# Patient Record
Sex: Female | Born: 1965 | Race: White | Hispanic: No | Marital: Married | Smoking: Never smoker
Health system: Southern US, Community
[De-identification: ages and names within clinical notes are randomized; demographics above are authoritative.]

## PROBLEM LIST (undated history)

## (undated) DIAGNOSIS — T4145XA Adverse effect of unspecified anesthetic, initial encounter: Secondary | ICD-10-CM

## (undated) DIAGNOSIS — T8859XA Other complications of anesthesia, initial encounter: Secondary | ICD-10-CM

## (undated) DIAGNOSIS — E785 Hyperlipidemia, unspecified: Secondary | ICD-10-CM

## (undated) HISTORY — PX: TONSILLECTOMY: SUR1361

## (undated) HISTORY — PX: WISDOM TOOTH EXTRACTION: SHX21

---

## 1999-01-28 ENCOUNTER — Ambulatory Visit (HOSPITAL_COMMUNITY): Admission: RE | Admit: 1999-01-28 | Discharge: 1999-01-28 | Payer: Self-pay | Admitting: Obstetrics and Gynecology

## 1999-04-07 ENCOUNTER — Inpatient Hospital Stay (HOSPITAL_COMMUNITY): Admission: AD | Admit: 1999-04-07 | Discharge: 1999-04-09 | Payer: Self-pay | Admitting: Obstetrics and Gynecology

## 2003-02-18 ENCOUNTER — Other Ambulatory Visit: Admission: RE | Admit: 2003-02-18 | Discharge: 2003-02-18 | Payer: Self-pay | Admitting: *Deleted

## 2004-04-27 ENCOUNTER — Other Ambulatory Visit: Admission: RE | Admit: 2004-04-27 | Discharge: 2004-04-27 | Payer: Self-pay | Admitting: Obstetrics and Gynecology

## 2005-05-04 ENCOUNTER — Other Ambulatory Visit: Admission: RE | Admit: 2005-05-04 | Discharge: 2005-05-04 | Payer: Self-pay | Admitting: Obstetrics and Gynecology

## 2005-05-11 ENCOUNTER — Ambulatory Visit (HOSPITAL_COMMUNITY): Admission: RE | Admit: 2005-05-11 | Discharge: 2005-05-11 | Payer: Self-pay | Admitting: Obstetrics and Gynecology

## 2006-05-05 ENCOUNTER — Other Ambulatory Visit: Admission: RE | Admit: 2006-05-05 | Discharge: 2006-05-05 | Payer: Self-pay | Admitting: Obstetrics and Gynecology

## 2009-05-25 ENCOUNTER — Ambulatory Visit (HOSPITAL_COMMUNITY): Admission: RE | Admit: 2009-05-25 | Discharge: 2009-05-25 | Payer: Self-pay | Admitting: Obstetrics and Gynecology

## 2011-06-14 ENCOUNTER — Other Ambulatory Visit (HOSPITAL_COMMUNITY): Payer: Self-pay | Admitting: Obstetrics and Gynecology

## 2011-06-14 DIAGNOSIS — Z1231 Encounter for screening mammogram for malignant neoplasm of breast: Secondary | ICD-10-CM

## 2011-06-15 ENCOUNTER — Ambulatory Visit (HOSPITAL_COMMUNITY)
Admission: RE | Admit: 2011-06-15 | Discharge: 2011-06-15 | Disposition: A | Discharge status: Home / Self Care | Payer: Managed Care, Other (non HMO) | Source: Ambulatory Visit | Attending: Obstetrics and Gynecology | Admitting: Obstetrics and Gynecology

## 2011-06-15 DIAGNOSIS — Z1231 Encounter for screening mammogram for malignant neoplasm of breast: Secondary | ICD-10-CM | POA: Insufficient documentation

## 2012-07-17 ENCOUNTER — Ambulatory Visit: Payer: Self-pay | Admitting: Obstetrics and Gynecology

## 2014-07-23 ENCOUNTER — Other Ambulatory Visit: Payer: Self-pay | Admitting: Obstetrics and Gynecology

## 2014-10-01 ENCOUNTER — Other Ambulatory Visit: Payer: Self-pay | Admitting: Obstetrics and Gynecology

## 2014-10-01 NOTE — Patient Instructions (Addendum)
   Your procedure is scheduled on: Monday, Dec 14  Enter through the Main Entrance of [facility] at: 10:45 AM Pick up the phone at the desk and dial [phone] and inform us of your arrival.  Please call this number if you have any problems the morning of surgery: [phone]  Remember: Do not eat food after midnight: Sunday Do not drink clear liquids after: 8 AM Monday, day of surgery Take these medicines the morning of surgery with a SIP OF WATER:  None  Do not wear jewelry, make-up, or FINGER nail polish No metal in your hair or on your body. Do not wear lotions, powders, perfumes.  You may wear deodorant.  Do not bring valuables to the hospital. Contacts, dentures or bridgework may not be worn into surgery.  Leave suitcase in the car. After Surgery it may be brought to your room. For patients being admitted to the hospital, checkout time is 11:00am the day of discharge.  Home with husband Barbaraann Rondo cell [phone]

## 2014-10-02 ENCOUNTER — Encounter (HOSPITAL_COMMUNITY): Payer: Self-pay

## 2014-10-02 ENCOUNTER — Encounter (HOSPITAL_COMMUNITY)
Admission: RE | Admit: 2014-10-02 | Discharge: 2014-10-02 | Disposition: A | Discharge status: Home / Self Care | Payer: BC Managed Care – PPO | Source: Ambulatory Visit | Attending: Obstetrics and Gynecology | Admitting: Obstetrics and Gynecology

## 2014-10-02 DIAGNOSIS — Z01812 Encounter for preprocedural laboratory examination: Secondary | ICD-10-CM | POA: Diagnosis present

## 2014-10-02 HISTORY — DX: Hyperlipidemia, unspecified: E78.5

## 2014-10-02 LAB — CBC
HEMATOCRIT: 38.4 % (ref 36.0–46.0)
HEMOGLOBIN: 12.8 g/dL (ref 12.0–15.0)
MCH: 28.3 pg (ref 26.0–34.0)
MCHC: 33.3 g/dL (ref 30.0–36.0)
MCV: 85 fL (ref 78.0–100.0)
Platelets: 208 10*3/uL (ref 150–400)
RBC: 4.52 MIL/uL (ref 3.87–5.11)
RDW: 13.6 % (ref 11.5–15.5)
WBC: 6.3 10*3/uL (ref 4.0–10.5)

## 2014-10-04 ENCOUNTER — Other Ambulatory Visit (HOSPITAL_COMMUNITY): Payer: Self-pay | Admitting: Obstetrics and Gynecology

## 2014-10-04 NOTE — H&P (Signed)
Jillian Dennis is a 48 y.o. 30 female P 2-0-0-2 presents for a hysterectomy with anterior-posterior colporrhaphy  because of uterine fibroids, menorrhagia and symptomatic pelvic prolapse.  For the past two years the patient has had an increasingly heavy menstrual flow.  Her 7 day period will require an hourly pad change due to large clots and is accompanied by cramping that she rates at 7/10 on a 10 point pain scale.  Fortunately she finds relief with Ibuprofen 600 mg.  She denies dyspareunia, inter-menstrual bleeding or changes in bowel funciton.  She does report, however leaking of urine with coughing or sneezing and occasional urgency.   An endometrial biopsy in September 2015 showed secretory endometrium with no evidence of malignancy or hyperplasia.  Her TSH, CBC and Prolactin levels were all normal.  In September of 2014 a pelvic ultrasound showed a uterus: 8.29 x 7.31 x 7.02 cm with a posterior sub-serosal fibroid measuring:   4.9 x 4.3 x 4.7 cm with both ovaries appearing normal on that study.  A review of both medical and surgical management options were givne to the patient however, she desire definitive therapy in the form of hysterectomy and anterior-posterior colporrhaphy.  Additonally, due to her family history of ovarian cancer,  the patient would also like to have both of her ovaries removed.   Past Medical History  OB History: G: 2;  P: 2-0-0-2; SVB 1997 and 2000, largest infant weighed 8 lbs. 2 oz.  GYN History: menarche:48 YO    LMP: 09/29/14  Contracepton condoms  The patient reports a past history of: genital warts.  Denies history of abnormal PAP smear  Last PAP smear: August 2015-normal  Medical History: NONE  Surgical History: 1978  Tonsillectomy Denies problems with anesthesia (though awakened during procedure) or history of blood transfusions  Family History: Heart Disease, Rheumatoid Arthritis and Ovarian Cancer  Social History: Married and employed as a Pharmacist, hospital;  Denies  tobacco use and rarely consumes alcohol   Outpatient Encounter Prescriptions as of 10/04/2014  Medication Sig  . Calcium Carbonate-Vit D-Min (CALCIUM 1200) 1200-1000 MG-UNIT CHEW Chew 1 tablet by mouth daily.  Marland Kitchen ibuprofen (ADVIL,MOTRIN) 200 MG tablet Take 400 mg by mouth every 6 (six) hours as needed for moderate pain.  . Multiple Vitamins-Minerals (MULTIVITAMIN WITH MINERALS) tablet Take 1 tablet by mouth daily.    No Known Allergies   Denies sensitivity to peanuts, shellfish, soy, latex or adhesives.   ROS: Admits to urinary incontinence with cough or sneeze but denies corrective lenses,  headache, vision changes, nasal congestion, dysphagia, tinnitus, dizziness, hoarseness, cough,  chest pain, shortness of breath, nausea, vomiting, diarrhea,constipation,  urinary frequency, urgency  dysuria, hematuria, vaginitis symptoms, pelvic pain, swelling of joints,easy bruising,  myalgias, arthralgias, skin rashes, unexplained weight loss and except as is mentioned in the history of present illness, patient's review of systems is otherwise negative.   Physical Exam  Bp: 100/62    P: 76    R: 18    Temperature:  98 degrees F orally    Weight:  150 lbs.  Height: 5'4"    BMi:  25.7  Neck: supple without masses or thyromegaly Lungs: clear to auscultation Heart: regular rate and rhythm Abdomen: soft, non-tender and no organomegaly Pelvic:EGBUS- wnl; vagina-2-3/4 cystocele and 2/4 rectocele; uterus-normal size with 2/4 descensus, cervix without lesions or motion tenderness; adnexae-no tenderness or masses Extremities:  no clubbing, cyanosis or edema   Assesment:  Menorrhagia  Symptomatic Uterine Fibroid            Symptomatic Pelvic Relaxation   Disposition:  A discussion was held with patient regarding the indication for her procedure(s) along with the risks, which include but are not limited to: reaction to anesthesia, damage to adjacent organs, infection and excessive bleeding.  The  patient verbalized understanding of these risks and has consented to proceed with a Total Vaginal Hysterectomy, Possible Bilateral Salpingectomy, Possible Bilateral Salpingo-oophorectomy, with Anterior-Posterior Colporrhaphy and Possible Total Abdominal Hysterectomy at [place] on October 13, 2014.   CSN# [id number]   Ziomara Birenbaum J. Florene Glen, PA-C  for Dr. Harvie Bridge. Mancel Bale

## 2014-10-08 ENCOUNTER — Other Ambulatory Visit (HOSPITAL_COMMUNITY): Payer: Self-pay | Admitting: Obstetrics and Gynecology

## 2014-10-13 ENCOUNTER — Encounter (HOSPITAL_COMMUNITY)
Admission: RE | Disposition: A | Discharge status: Home / Self Care | Payer: Self-pay | Source: Ambulatory Visit | Attending: Obstetrics and Gynecology

## 2014-10-13 ENCOUNTER — Encounter (HOSPITAL_COMMUNITY): Payer: Self-pay | Admitting: Anesthesiology

## 2014-10-13 ENCOUNTER — Ambulatory Visit (HOSPITAL_COMMUNITY): Payer: BC Managed Care – PPO | Admitting: Anesthesiology

## 2014-10-13 ENCOUNTER — Observation Stay (HOSPITAL_COMMUNITY)
Admission: RE | Admit: 2014-10-13 | Discharge: 2014-10-16 | Disposition: A | Discharge status: Home / Self Care | Payer: BC Managed Care – PPO | Source: Ambulatory Visit | Attending: Obstetrics and Gynecology | Admitting: Obstetrics and Gynecology

## 2014-10-13 DIAGNOSIS — Z8041 Family history of malignant neoplasm of ovary: Secondary | ICD-10-CM | POA: Insufficient documentation

## 2014-10-13 DIAGNOSIS — N814 Uterovaginal prolapse, unspecified: Secondary | ICD-10-CM | POA: Insufficient documentation

## 2014-10-13 DIAGNOSIS — N920 Excessive and frequent menstruation with regular cycle: Principal | ICD-10-CM | POA: Insufficient documentation

## 2014-10-13 DIAGNOSIS — R509 Fever, unspecified: Secondary | ICD-10-CM

## 2014-10-13 DIAGNOSIS — R0902 Hypoxemia: Secondary | ICD-10-CM

## 2014-10-13 DIAGNOSIS — D259 Leiomyoma of uterus, unspecified: Secondary | ICD-10-CM | POA: Diagnosis not present

## 2014-10-13 HISTORY — PX: VAGINAL HYSTERECTOMY: SHX2639

## 2014-10-13 LAB — PREGNANCY, URINE: Preg Test, Ur: NEGATIVE

## 2014-10-13 SURGERY — HYSTERECTOMY, VAGINAL
Anesthesia: General | Site: Vagina

## 2014-10-13 MED ORDER — KETOROLAC TROMETHAMINE 30 MG/ML IJ SOLN: 15.0000 mg | Freq: Once | INTRAMUSCULAR | Status: DC | PRN

## 2014-10-13 MED ORDER — HYDROMORPHONE HCL 1 MG/ML IJ SOLN
0.2500 mg | INTRAMUSCULAR | Status: DC | PRN
  Administered 2014-10-13 (×2): 0.5 mg via INTRAVENOUS

## 2014-10-13 MED ORDER — MEPERIDINE HCL 25 MG/ML IJ SOLN: 6.2500 mg | INTRAMUSCULAR | Status: DC | PRN

## 2014-10-13 MED ORDER — HYDROMORPHONE HCL 1 MG/ML IJ SOLN
INTRAMUSCULAR | Status: DC | PRN
  Administered 2014-10-13 (×2): 1 mg via INTRAVENOUS

## 2014-10-13 MED ORDER — ONDANSETRON HCL 4 MG PO TABS: 4.0000 mg | ORAL_TABLET | Freq: Three times a day (TID) | ORAL | Status: DC | PRN

## 2014-10-13 MED ORDER — HYDROMORPHONE HCL 1 MG/ML IJ SOLN
INTRAMUSCULAR | Status: AC
  Filled 2014-10-13: qty 1

## 2014-10-13 MED ORDER — ONDANSETRON HCL 4 MG/2ML IJ SOLN
INTRAMUSCULAR | Status: AC
  Filled 2014-10-13: qty 2

## 2014-10-13 MED ORDER — DIPHENHYDRAMINE HCL 50 MG/ML IJ SOLN: 12.5000 mg | Freq: Four times a day (QID) | INTRAMUSCULAR | Status: DC | PRN

## 2014-10-13 MED ORDER — FENTANYL CITRATE 0.05 MG/ML IJ SOLN
INTRAMUSCULAR | Status: AC
Start: 2014-10-13 — End: 2014-10-13
  Filled 2014-10-13: qty 5

## 2014-10-13 MED ORDER — 0.9 % SODIUM CHLORIDE (POUR BTL) OPTIME
TOPICAL | Status: DC | PRN
  Administered 2014-10-13: 1000 mL

## 2014-10-13 MED ORDER — MIDAZOLAM HCL 2 MG/2ML IJ SOLN
INTRAMUSCULAR | Status: AC
  Filled 2014-10-13: qty 2

## 2014-10-13 MED ORDER — PROPOFOL 10 MG/ML IV EMUL
INTRAVENOUS | Status: AC
  Filled 2014-10-13: qty 20

## 2014-10-13 MED ORDER — LACTATED RINGERS IV SOLN
INTRAVENOUS | Status: DC
  Administered 2014-10-13 – 2014-10-14 (×3): via INTRAVENOUS

## 2014-10-13 MED ORDER — ONDANSETRON HCL 4 MG/2ML IJ SOLN: INTRAMUSCULAR | Status: DC | PRN

## 2014-10-13 MED ORDER — SODIUM CHLORIDE 0.9 % IV SOLN
INTRAVENOUS | Status: DC | PRN
  Administered 2014-10-13: 9 mL via INTRAMUSCULAR
  Administered 2014-10-13: 6 mL via INTRAMUSCULAR

## 2014-10-13 MED ORDER — NALOXONE HCL 0.4 MG/ML IJ SOLN
0.4000 mg | INTRAMUSCULAR | Status: DC | PRN
Start: 2014-10-13 — End: 2014-10-14

## 2014-10-13 MED ORDER — KETOROLAC TROMETHAMINE 30 MG/ML IJ SOLN
INTRAMUSCULAR | Status: DC | PRN
  Administered 2014-10-13: 30 mg via INTRAVENOUS

## 2014-10-13 MED ORDER — BUPIVACAINE HCL (PF) 0.25 % IJ SOLN
INTRAMUSCULAR | Status: AC
  Filled 2014-10-13: qty 30

## 2014-10-13 MED ORDER — ROCURONIUM BROMIDE 100 MG/10ML IV SOLN
INTRAVENOUS | Status: DC | PRN
  Administered 2014-10-13 (×3): 10 mg via INTRAVENOUS
  Administered 2014-10-13: 50 mg via INTRAVENOUS

## 2014-10-13 MED ORDER — METHYLENE BLUE 1 % INJ SOLN
INTRAMUSCULAR | Status: DC | PRN
  Administered 2014-10-13: 10 mg via INTRAVENOUS

## 2014-10-13 MED ORDER — SCOPOLAMINE 1 MG/3DAYS TD PT72
1.0000 | MEDICATED_PATCH | Freq: Once | TRANSDERMAL | Status: DC
  Administered 2014-10-13: 1.5 mg via TRANSDERMAL

## 2014-10-13 MED ORDER — METHYLENE BLUE 1 % INJ SOLN
INTRAMUSCULAR | Status: AC
  Filled 2014-10-13: qty 1

## 2014-10-13 MED ORDER — MIDAZOLAM HCL 2 MG/2ML IJ SOLN: 0.5000 mg | Freq: Once | INTRAMUSCULAR | Status: DC | PRN

## 2014-10-13 MED ORDER — GLYCOPYRROLATE 0.2 MG/ML IJ SOLN
INTRAMUSCULAR | Status: AC
  Filled 2014-10-13: qty 1

## 2014-10-13 MED ORDER — ESTRADIOL 0.1 MG/GM VA CREA
TOPICAL_CREAM | VAGINAL | Status: DC | PRN
  Administered 2014-10-13: 1 via VAGINAL

## 2014-10-13 MED ORDER — ONDANSETRON HCL 4 MG/2ML IJ SOLN
4.0000 mg | Freq: Four times a day (QID) | INTRAMUSCULAR | Status: DC | PRN
  Administered 2014-10-14: 4 mg via INTRAVENOUS
  Filled 2014-10-13: qty 2

## 2014-10-13 MED ORDER — DOCUSATE SODIUM 100 MG PO CAPS
100.0000 mg | ORAL_CAPSULE | Freq: Two times a day (BID) | ORAL | Status: AC
  Administered 2014-10-14 – 2014-10-15 (×3): 100 mg via ORAL
  Filled 2014-10-13 (×3): qty 1

## 2014-10-13 MED ORDER — MENTHOL 3 MG MT LOZG
1.0000 | LOZENGE | OROMUCOSAL | Status: DC | PRN
  Filled 2014-10-13: qty 9

## 2014-10-13 MED ORDER — LACTATED RINGERS IV SOLN
INTRAVENOUS | Status: DC
  Administered 2014-10-13 (×2): via INTRAVENOUS

## 2014-10-13 MED ORDER — LIDOCAINE HCL (CARDIAC) 20 MG/ML IV SOLN
INTRAVENOUS | Status: DC | PRN
  Administered 2014-10-13: 40 mg via INTRAVENOUS
  Administered 2014-10-13: 30 mg via INTRAVENOUS

## 2014-10-13 MED ORDER — VASOPRESSIN 20 UNIT/ML IV SOLN
INTRAVENOUS | Status: AC
  Filled 2014-10-13: qty 1

## 2014-10-13 MED ORDER — KETOROLAC TROMETHAMINE 30 MG/ML IJ SOLN
30.0000 mg | Freq: Four times a day (QID) | INTRAMUSCULAR | Status: AC
  Administered 2014-10-13 – 2014-10-14 (×3): 30 mg via INTRAVENOUS
  Filled 2014-10-13 (×3): qty 1

## 2014-10-13 MED ORDER — HEPARIN SODIUM (PORCINE) 5000 UNIT/ML IJ SOLN
INTRAMUSCULAR | Status: AC
  Filled 2014-10-13: qty 1

## 2014-10-13 MED ORDER — FENTANYL CITRATE 0.05 MG/ML IJ SOLN
INTRAMUSCULAR | Status: DC | PRN
  Administered 2014-10-13 (×5): 50 ug via INTRAVENOUS

## 2014-10-13 MED ORDER — CEFAZOLIN SODIUM-DEXTROSE 2-3 GM-% IV SOLR
2.0000 g | INTRAVENOUS | Status: AC
  Administered 2014-10-13: 2 g via INTRAVENOUS

## 2014-10-13 MED ORDER — GLYCOPYRROLATE 0.2 MG/ML IJ SOLN
INTRAMUSCULAR | Status: DC | PRN
  Administered 2014-10-13: 0.2 mg via INTRAVENOUS
  Administered 2014-10-13: 0.4 mg via INTRAVENOUS

## 2014-10-13 MED ORDER — DIPHENHYDRAMINE HCL 12.5 MG/5ML PO ELIX: 12.5000 mg | ORAL_SOLUTION | Freq: Four times a day (QID) | ORAL | Status: DC | PRN

## 2014-10-13 MED ORDER — NEOSTIGMINE METHYLSULFATE 10 MG/10ML IV SOLN
INTRAVENOUS | Status: AC
  Filled 2014-10-13: qty 1

## 2014-10-13 MED ORDER — HYDROMORPHONE 0.3 MG/ML IV SOLN
INTRAVENOUS | Status: DC
  Administered 2014-10-13: 18:00:00 via INTRAVENOUS
  Administered 2014-10-13: 2.1 mg via INTRAVENOUS
  Administered 2014-10-14: 1.2 mg via INTRAVENOUS
  Administered 2014-10-14: 0.9 mg via INTRAVENOUS
  Filled 2014-10-13: qty 25

## 2014-10-13 MED ORDER — PROMETHAZINE HCL 25 MG/ML IJ SOLN: 6.2500 mg | INTRAMUSCULAR | Status: DC | PRN

## 2014-10-13 MED ORDER — DEXAMETHASONE SODIUM PHOSPHATE 4 MG/ML IJ SOLN
INTRAMUSCULAR | Status: AC
  Filled 2014-10-13: qty 1

## 2014-10-13 MED ORDER — SODIUM CHLORIDE 0.9 % IJ SOLN
INTRAMUSCULAR | Status: AC
  Filled 2014-10-13: qty 100

## 2014-10-13 MED ORDER — NEOSTIGMINE METHYLSULFATE 10 MG/10ML IV SOLN
INTRAVENOUS | Status: DC | PRN
Start: 2014-10-13 — End: 2014-10-13
  Administered 2014-10-13: 3 mg via INTRAVENOUS

## 2014-10-13 MED ORDER — SODIUM CHLORIDE 0.9 % IJ SOLN: 9.0000 mL | INTRAMUSCULAR | Status: DC | PRN

## 2014-10-13 MED ORDER — PROPOFOL 10 MG/ML IV BOLUS
INTRAVENOUS | Status: DC | PRN
  Administered 2014-10-13: 160 mg via INTRAVENOUS

## 2014-10-13 MED ORDER — SCOPOLAMINE 1 MG/3DAYS TD PT72
MEDICATED_PATCH | TRANSDERMAL | Status: AC
  Administered 2014-10-13: 1.5 mg via TRANSDERMAL
  Filled 2014-10-13: qty 1

## 2014-10-13 MED ORDER — OXYCODONE-ACETAMINOPHEN 5-325 MG PO TABS
1.0000 | ORAL_TABLET | ORAL | Status: DC | PRN
  Administered 2014-10-14: 2 via ORAL
  Administered 2014-10-14: 1 via ORAL
  Administered 2014-10-14: 2 via ORAL
  Administered 2014-10-15 (×2): 1 via ORAL
  Administered 2014-10-15 (×2): 2 via ORAL
  Administered 2014-10-15: 1 via ORAL
  Administered 2014-10-16 (×2): 2 via ORAL
  Administered 2014-10-16 (×2): 1 via ORAL
  Filled 2014-10-13 (×2): qty 1
  Filled 2014-10-13 (×2): qty 2
  Filled 2014-10-13 (×2): qty 1
  Filled 2014-10-13 (×3): qty 2
  Filled 2014-10-13 (×2): qty 1
  Filled 2014-10-13: qty 2

## 2014-10-13 MED ORDER — ROCURONIUM BROMIDE 100 MG/10ML IV SOLN
INTRAVENOUS | Status: AC
  Filled 2014-10-13: qty 1

## 2014-10-13 MED ORDER — MIDAZOLAM HCL 2 MG/2ML IJ SOLN
INTRAMUSCULAR | Status: DC | PRN
  Administered 2014-10-13: 1 mg via INTRAVENOUS

## 2014-10-13 MED ORDER — KETOROLAC TROMETHAMINE 30 MG/ML IJ SOLN
INTRAMUSCULAR | Status: AC
  Filled 2014-10-13: qty 1

## 2014-10-13 MED ORDER — GLYCOPYRROLATE 0.2 MG/ML IJ SOLN
INTRAMUSCULAR | Status: AC
  Filled 2014-10-13: qty 3

## 2014-10-13 MED ORDER — SODIUM CHLORIDE 0.9 % IJ SOLN
INTRAMUSCULAR | Status: AC
  Filled 2014-10-13: qty 10

## 2014-10-13 MED ORDER — CEFAZOLIN SODIUM-DEXTROSE 2-3 GM-% IV SOLR
INTRAVENOUS | Status: AC
  Filled 2014-10-13: qty 50

## 2014-10-13 MED ORDER — IBUPROFEN 600 MG PO TABS
600.0000 mg | ORAL_TABLET | Freq: Four times a day (QID) | ORAL | Status: DC | PRN
  Administered 2014-10-14 – 2014-10-16 (×5): 600 mg via ORAL
  Filled 2014-10-13 (×5): qty 1

## 2014-10-13 MED ORDER — LIDOCAINE HCL (CARDIAC) 20 MG/ML IV SOLN
INTRAVENOUS | Status: AC
  Filled 2014-10-13: qty 5

## 2014-10-13 MED ORDER — ESTRADIOL 0.1 MG/GM VA CREA
TOPICAL_CREAM | VAGINAL | Status: AC
  Filled 2014-10-13: qty 42.5

## 2014-10-13 MED ORDER — DEXAMETHASONE SODIUM PHOSPHATE 10 MG/ML IJ SOLN
INTRAMUSCULAR | Status: DC | PRN
  Administered 2014-10-13: 4 mg via INTRAVENOUS

## 2014-10-13 SURGICAL SUPPLY — 99 items
BARRIER ADHS 3X4 INTERCEED (GAUZE/BANDAGES/DRESSINGS) IMPLANT
BLADE SURG 10 STRL SS (BLADE) ×4 IMPLANT
BLADE SURG 11 STRL SS (BLADE) IMPLANT
BRR ADH 4X3 ABS CNTRL BYND (GAUZE/BANDAGES/DRESSINGS)
CABLE HIGH FREQUENCY MONO STRZ (ELECTRODE) IMPLANT
CANISTER SUCT 3000ML (MISCELLANEOUS) ×8 IMPLANT
CATH FOLEY 2W 5CC 18FR LF (CATHETERS) ×4 IMPLANT
CATH ROBINSON RED A/P 16FR (CATHETERS) IMPLANT
CLOSURE WOUND 1/4 X3 (GAUZE/BANDAGES/DRESSINGS)
CLOTH BEACON ORANGE TIMEOUT ST (SAFETY) ×4 IMPLANT
CONT PATH 16OZ SNAP LID 3702 (MISCELLANEOUS) ×4 IMPLANT
COVER BACK TABLE 60X90IN (DRAPES) ×4 IMPLANT
COVER MAYO STAND STRL (DRAPES) ×4 IMPLANT
DECANTER SPIKE VIAL GLASS SM (MISCELLANEOUS) ×4 IMPLANT
DISSECTOR SPONGE CHERRY (GAUZE/BANDAGES/DRESSINGS) IMPLANT
DRAPE HYSTEROSCOPY (DRAPE) IMPLANT
DRAPE SHEET LG 3/4 BI-LAMINATE (DRAPES) ×8 IMPLANT
DRAPE STERI URO 9X17 APER PCH (DRAPES) ×4 IMPLANT
DRAPE WARM FLUID 44X44 (DRAPE) IMPLANT
DRSG COVADERM PLUS 2X2 (GAUZE/BANDAGES/DRESSINGS) IMPLANT
DRSG OPSITE POSTOP 3X4 (GAUZE/BANDAGES/DRESSINGS) IMPLANT
DRSG OPSITE POSTOP 4X10 (GAUZE/BANDAGES/DRESSINGS) IMPLANT
DRSG TELFA 3X8 NADH (GAUZE/BANDAGES/DRESSINGS) ×4 IMPLANT
DURAPREP 26ML APPLICATOR (WOUND CARE) ×4 IMPLANT
ELECT REM PT RETURN 9FT ADLT (ELECTROSURGICAL) ×4
ELECTRODE REM PT RTRN 9FT ADLT (ELECTROSURGICAL) ×2 IMPLANT
EVACUATOR SMOKE 8.L (FILTER) ×4 IMPLANT
FORCEPS CUTTING 33CM 5MM (CUTTING FORCEPS) IMPLANT
GAUZE PACKING 2X5 YD STRL (GAUZE/BANDAGES/DRESSINGS) ×4 IMPLANT
GAUZE SPONGE 4X4 16PLY XRAY LF (GAUZE/BANDAGES/DRESSINGS) ×8 IMPLANT
GLOVE BIO SURGEON STRL SZ7.5 (GLOVE) ×12 IMPLANT
GLOVE BIOGEL PI IND STRL 6.5 (GLOVE) ×6 IMPLANT
GLOVE BIOGEL PI IND STRL 7.0 (GLOVE) IMPLANT
GLOVE BIOGEL PI IND STRL 7.5 (GLOVE) ×4 IMPLANT
GLOVE BIOGEL PI INDICATOR 6.5 (GLOVE) ×6
GLOVE BIOGEL PI INDICATOR 7.0 (GLOVE)
GLOVE BIOGEL PI INDICATOR 7.5 (GLOVE) ×4
GOWN STRL REUS W/ TWL LRG LVL3 (GOWN DISPOSABLE) ×14 IMPLANT
GOWN STRL REUS W/TWL LRG LVL3 (GOWN DISPOSABLE) ×44 IMPLANT
HEMOSTAT SURGICEL 4X8 (HEMOSTASIS) IMPLANT
LIQUID BAND (GAUZE/BANDAGES/DRESSINGS) IMPLANT
NEEDLE HYPO 22GX1.5 SAFETY (NEEDLE) ×4 IMPLANT
NEEDLE HYPO 25X1 1.5 SAFETY (NEEDLE) IMPLANT
NEEDLE INSUFFLATION 120MM (ENDOMECHANICALS) IMPLANT
NEEDLE MAYO .5 CIRCLE (NEEDLE) IMPLANT
NS IRRIG 1000ML POUR BTL (IV SOLUTION) ×4 IMPLANT
OCCLUDER COLPOPNEUMO (BALLOONS) IMPLANT
PACK LAVH (CUSTOM PROCEDURE TRAY) ×4 IMPLANT
PACK VAGINAL WOMENS (CUSTOM PROCEDURE TRAY) ×4 IMPLANT
PAD OB MATERNITY 4.3X12.25 (PERSONAL CARE ITEMS) ×8 IMPLANT
PAD TRENDELENBURG OR TABLE (MISCELLANEOUS) ×4 IMPLANT
PROTECTOR NERVE ULNAR (MISCELLANEOUS) IMPLANT
SCISSORS LAP 5X35 DISP (ENDOMECHANICALS) IMPLANT
SET CYSTO W/LG BORE CLAMP LF (SET/KITS/TRAYS/PACK) ×4 IMPLANT
SET IRRIG TUBING LAPAROSCOPIC (IRRIGATION / IRRIGATOR) IMPLANT
SHEARS HARMONIC ACE PLUS 36CM (ENDOMECHANICALS) IMPLANT
SLEEVE XCEL OPT CAN 5 100 (ENDOMECHANICALS) IMPLANT
SOLUTION ELECTROLUBE (MISCELLANEOUS) IMPLANT
SPONGE LAP 18X18 X RAY DECT (DISPOSABLE) IMPLANT
STAPLER VISISTAT 35W (STAPLE) IMPLANT
STRIP CLOSURE SKIN 1/4X3 (GAUZE/BANDAGES/DRESSINGS) IMPLANT
SUT CHROMIC 2 0 CT 1 (SUTURE) IMPLANT
SUT CHROMIC 2 0 SH (SUTURE) IMPLANT
SUT CHROMIC 2 0 TIES 18 (SUTURE) IMPLANT
SUT MNCRL AB 3-0 PS2 27 (SUTURE) IMPLANT
SUT MON AB 3-0 SH 27 (SUTURE)
SUT MON AB 3-0 SH27 (SUTURE) IMPLANT
SUT MON AB 4-0 PS1 27 (SUTURE) ×4 IMPLANT
SUT PDS AB 1 CT1 36 (SUTURE) IMPLANT
SUT PDS AB 1 CTX 36 (SUTURE) IMPLANT
SUT PLAIN 2 0 XLH (SUTURE) IMPLANT
SUT VIC AB 0 CT1 18XCR BRD8 (SUTURE) ×8 IMPLANT
SUT VIC AB 0 CT1 27 (SUTURE) ×12
SUT VIC AB 0 CT1 27XBRD ANBCTR (SUTURE) ×8 IMPLANT
SUT VIC AB 0 CT1 36 (SUTURE) ×4 IMPLANT
SUT VIC AB 0 CT1 8-18 (SUTURE) ×16
SUT VIC AB 2-0 CT1 27 (SUTURE) ×44
SUT VIC AB 2-0 CT1 TAPERPNT 27 (SUTURE) ×22 IMPLANT
SUT VIC AB 2-0 SH 27 (SUTURE) ×16
SUT VIC AB 2-0 SH 27XBRD (SUTURE) ×8 IMPLANT
SUT VIC AB 3-0 SH 27 (SUTURE) ×12
SUT VIC AB 3-0 SH 27X BRD (SUTURE) ×6 IMPLANT
SUT VICRYL 0 TIES 12 18 (SUTURE) ×4 IMPLANT
SUT VICRYL 0 UR6 27IN ABS (SUTURE) IMPLANT
SYR 50ML LL SCALE MARK (SYRINGE) IMPLANT
SYR CONTROL 10ML LL (SYRINGE) IMPLANT
SYR TB 1ML 25GX5/8 (SYRINGE) ×4 IMPLANT
SYR TB 1ML LUER SLIP (SYRINGE) ×4 IMPLANT
TIP UTERINE 5.1X6CM LAV DISP (MISCELLANEOUS) IMPLANT
TIP UTERINE 6.7X10CM GRN DISP (MISCELLANEOUS) IMPLANT
TIP UTERINE 6.7X6CM WHT DISP (MISCELLANEOUS) IMPLANT
TIP UTERINE 6.7X8CM BLUE DISP (MISCELLANEOUS) IMPLANT
TOWEL OR 17X24 6PK STRL BLUE (TOWEL DISPOSABLE) ×8 IMPLANT
TRAY FOLEY CATH 14FR (SET/KITS/TRAYS/PACK) ×4 IMPLANT
TROCAR BALLN 12MMX100 BLUNT (TROCAR) IMPLANT
TROCAR XCEL NON-BLD 11X100MML (ENDOMECHANICALS) IMPLANT
TROCAR XCEL NON-BLD 5MMX100MML (ENDOMECHANICALS) IMPLANT
WARMER LAPAROSCOPE (MISCELLANEOUS) ×4 IMPLANT
WATER STERILE IRR 1000ML POUR (IV SOLUTION) ×4 IMPLANT

## 2014-10-13 NOTE — Transfer of Care (Signed)
Immediate Anesthesia Transfer of Care Note  Patient: Jillian Dennis  Procedure(s) Performed: Procedure(s): TOTAL VAGINAL HYSTERECTOMY BILATERAL SALPINGECTOMY WITH ANTERIOR AND POSTERIOR REPAIR, CYSTOSCOPY (Bilateral)  Patient Location: PACU  Anesthesia Type:General  Level of Consciousness: awake, alert  and oriented  Airway & Oxygen Therapy: Patient Spontanous Breathing and Patient connected to nasal cannula oxygen  Post-op Assessment: Report given to PACU RN and Post -op Vital signs reviewed and stable  Post vital signs: Reviewed and stable  Complications: No apparent anesthesia complications

## 2014-10-13 NOTE — Discharge Instructions (Signed)
Call [facility] @ [phone] if:  You have a temperature greater than or equal to 100.4 degrees Farenheit orally You have pain that is not made better by the pain medication given and taken as directed You have excessive bleeding or problems urinating  Take Colace (Docusate Sodium/Stool Softener) 100 mg 2-3 times daily while taking narcotic pain medicine to avoid constipation or until bowel movements are regular.  You may drive after 1 week You may walk up steps You may shower   You may resume a regular diet Do not lift over 15 pounds for 6 weeks Avoid anything in vagina for 6 weeks (or until after your post-operative visit)

## 2014-10-13 NOTE — Op Note (Signed)
Preop Diagnosis: 1.Menorrhagia 2.Fibroids 3.Symptomatic Prolapse  Postop Diagnosis: 1.Menorrhagia 2.Fibroids 3.Symptomatic Prolapse  Procedure: 1.TVH 2.BS 3.Posterior Repair 4.Cystoscopy   Anesthesia: General   Anesthesiologist: Assunta Gambles, MD   Attending: Delice Lesch, MD   Assistant: Earnstine Regal, PA-C  Findings: Normal appearing bilateral ovaries and tubes.  Uterus with approximately 4cm posterior fibroid.  Pathology: Uterus, Cervix and Bilateral Fallopian Tubes  Fluids: 1500 cc  UOP: 900 cc  EBL: 284 cc  Complications: None  Procedure:The patient was taken to the operating room after the risks, benefits and alternatives were discussed with the patient. The patient verbalized understanding and consent signed and witnessed. The patient was placed under general anesthesia per the anesthesiologist and a timeout was performed per protocol. The patient was prepped and draped in the normal sterile fashion in the dorsal lithotomy position.  A weighted speculum was placed the patient's vagina and the anterior lip of the cervix was grasped with a single-tooth tenaculum and Dever retractors were placed for vaginal wall retraction. The cervix was circumscribed with the bovie after injecting the cervix with pitressin at a concentration of 20 units of Pitressin in 50 cc of normal saline.  Once the cervix was circumscribed the anterior cul-de-sac was entered without difficulty.  The posterior cul-de-sac was entered without difficulty as well.  Curved Heaney clamps were used to clamp the uterosacral and cardinal ligaments and the tissue was then cut and suture ligated using 0 Vicryl. This was done bilaterally and sequentially. The remaining parametrial tissue was clamped, cut and suture ligated using 0 Vicryl in a sequential and bilateral fashion as well.  The posterior fibroid was removed via myomectomy in order to exteriorize the uterine fundus.  The uterine fundus was exteriorized and the  remaining pedicles were bilaterally clamped, cut and suture ligated with 0 vicryl.  The uterus and cervix were handed off to be sent to pathology. The bilateral ovaries and fallopian tubes appeared to be within normal limits.  A kelly clamp was clamped along the length of the right fallopian tube.  Tube was excised and sent to pathology.  Two 0 vicryl ties were used to ligate the pedicle and good hemostasis noted.  The left fallopian tube was attempted to be grasped in a similar fashion however it did not have as much descensus so it had to be done sequentially.  The was some oozing along the mesosalpinx which was made hemostatic with a running and intermittently interlocked stitch of 3-0 vicryl along the edge.  The angles of the cuff were sutured using 0 vicryl.  The cuff was then repaired to the midline with figure-of-eight and interrupted stitches of 0 Vicryl.  The cuff was noted to be hemostatic.    The cystocele was not impressive not that the hysterectomy was performed.  Attention was then turned to the posterior vaginal wall where dilute Pitressin was injected. The posterior vaginal wall was incised and the underlying tissue was dissected away from the posterior vaginal wall. The rectocele was repaired using plication stitches of 2-0 Vicryl.  Excess tissue was excised and the posterior vaginal wall was repaired with 2-0 Vicryl via a running interlocking stitch after reinforcing the perineal body.  The perineum was repaired with 3-0 Vicryl via subcutaneous stitch.  The foley was removed and cytoscopy performed with efflux of ureters bilaterally.  Foley was replaced.  Estrogen soaked packing placed in vagina.    Sponge, lap and needle count was correct.  The patient tolerated the procedure well and was  returned to the recovery room in good condition.

## 2014-10-13 NOTE — Anesthesia Preprocedure Evaluation (Signed)
Anesthesia Evaluation  Patient identified by MRN, date of birth, ID band Patient awake    Reviewed: Allergy & Precautions, H&P , Patient's Chart, lab work & pertinent test results, reviewed documented beta blocker date and time   History of Anesthesia Complications Negative for: history of anesthetic complications  Airway Mallampati: II  TM Distance: >3 FB Neck ROM: full    Dental   Pulmonary  breath sounds clear to auscultation        Cardiovascular Exercise Tolerance: Good Rhythm:regular Rate:Normal     Neuro/Psych    GI/Hepatic   Endo/Other    Renal/GU      Musculoskeletal   Abdominal   Peds  Hematology   Anesthesia Other Findings   Reproductive/Obstetrics                             Anesthesia Physical Anesthesia Plan  ASA: I  Anesthesia Plan: General ETT   Post-op Pain Management:    Induction:   Airway Management Planned:   Additional Equipment:   Intra-op Plan:   Post-operative Plan:   Informed Consent: I have reviewed the patients History and Physical, chart, labs and discussed the procedure including the risks, benefits and alternatives for the proposed anesthesia with the patient or authorized representative who has indicated his/her understanding and acceptance.   Dental Advisory Given  Plan Discussed with: CRNA and Surgeon  Anesthesia Plan Comments:         Anesthesia Quick Evaluation

## 2014-10-13 NOTE — Progress Notes (Signed)
Day of Surgery Procedure(s) (LRB): TOTAL VAGINAL HYSTERECTOMY BILATERAL SALPINGECTOMY WITH POSTERIOR REPAIR, CYSTOSCOPY (Bilateral)  Subjective: Patient reports no complaints.  Denies N/V. Pain well controlled.    Objective: I have reviewed patient's vital signs and UOP about 100cc/1.5 hrs.  General: alert and no distress Resp: clear to auscultation bilaterally Cardio: regular rate and rhythm GI: soft, NT, decreased BS, no rebound or guarding Extremities: SCDs are on, no calf tenderness Vaginal Bleeding: none  Assessment: s/p Procedure(s): TOTAL VAGINAL HYSTERECTOMY BILATERAL SALPINGECTOMY WITH POSTERIOR REPAIR, CYSTOSCOPY (Bilateral): stable Progressing well  Plan: Advance diet Encourage IS  SCDs for DVT prophylaxis CBC in AM Good UOP   LOS: 0 days    Jillian Dennis Y 10/13/2014, 7:59 PM

## 2014-10-13 NOTE — Anesthesia Postprocedure Evaluation (Signed)
  Anesthesia Post-op Note Anesthesia Post Note  Patient: Jillian Dennis  Procedure(s) Performed: Procedure(s) (LRB): TOTAL VAGINAL HYSTERECTOMY BILATERAL SALPINGECTOMY WITH POSTERIOR REPAIR, CYSTOSCOPY (Bilateral)  Anesthesia type: General  Patient location: PACU  Post pain: Pain level controlled  Post assessment: Post-op Vital signs reviewed  Last Vitals:  Filed Vitals:   10/13/14 1639  BP: 119/77  Pulse:   Temp: 37.1 C  Resp: 16    Post vital signs: Reviewed  Level of consciousness: sedated  Complications: No apparent anesthesia complications

## 2014-10-13 NOTE — H&P (View-Only) (Signed)
Jillian Dennis is a 48 y.o. 23 female P 2-0-0-2 presents for a hysterectomy with anterior-posterior colporrhaphy  because of uterine fibroids, menorrhagia and symptomatic pelvic prolapse.  For the past two years the patient has had an increasingly heavy menstrual flow.  Her 7 day period will require an hourly pad change due to large clots and is accompanied by cramping that she rates at 7/10 on a 10 point pain scale.  Fortunately she finds relief with Ibuprofen 600 mg.  She denies dyspareunia, inter-menstrual bleeding or changes in bowel funciton.  She does report, however leaking of urine with coughing or sneezing and occasional urgency.   An endometrial biopsy in September 2015 showed secretory endometrium with no evidence of malignancy or hyperplasia.  Her TSH, CBC and Prolactin levels were all normal.  In September of 2014 a pelvic ultrasound showed a uterus: 8.29 x 7.31 x 7.02 cm with a posterior sub-serosal fibroid measuring:   4.9 x 4.3 x 4.7 cm with both ovaries appearing normal on that study.  A review of both medical and surgical management options were givne to the patient however, she desire definitive therapy in the form of hysterectomy and anterior-posterior colporrhaphy.  Additonally, due to her family history of ovarian cancer,  the patient would also like to have both of her ovaries removed.   Past Medical History  OB History: G: 2;  P: 2-0-0-2; SVB 1997 and 2000, largest infant weighed 8 lbs. 2 oz.  GYN History: menarche:48 YO    LMP: 09/29/14  Contracepton condoms  The patient reports a past history of: genital warts.  Denies history of abnormal PAP smear  Last PAP smear: August 2015-normal  Medical History: NONE  Surgical History: 1978  Tonsillectomy Denies problems with anesthesia (though awakened during procedure) or history of blood transfusions  Family History: Heart Disease, Rheumatoid Arthritis and Ovarian Cancer  Social History: Married and employed as a Pharmacist, hospital;  Denies  tobacco use and rarely consumes alcohol   Outpatient Encounter Prescriptions as of 10/04/2014  Medication Sig  . Calcium Carbonate-Vit D-Min (CALCIUM 1200) 1200-1000 MG-UNIT CHEW Chew 1 tablet by mouth daily.  Marland Kitchen ibuprofen (ADVIL,MOTRIN) 200 MG tablet Take 400 mg by mouth every 6 (six) hours as needed for moderate pain.  . Multiple Vitamins-Minerals (MULTIVITAMIN WITH MINERALS) tablet Take 1 tablet by mouth daily.    No Known Allergies   Denies sensitivity to peanuts, shellfish, soy, latex or adhesives.   ROS: Admits to urinary incontinence with cough or sneeze but denies corrective lenses,  headache, vision changes, nasal congestion, dysphagia, tinnitus, dizziness, hoarseness, cough,  chest pain, shortness of breath, nausea, vomiting, diarrhea,constipation,  urinary frequency, urgency  dysuria, hematuria, vaginitis symptoms, pelvic pain, swelling of joints,easy bruising,  myalgias, arthralgias, skin rashes, unexplained weight loss and except as is mentioned in the history of present illness, patient's review of systems is otherwise negative.   Physical Exam  Bp: 100/62    P: 76    R: 18    Temperature:  98 degrees F orally    Weight:  150 lbs.  Height: 5'4"    BMi:  25.7  Neck: supple without masses or thyromegaly Lungs: clear to auscultation Heart: regular rate and rhythm Abdomen: soft, non-tender and no organomegaly Pelvic:EGBUS- wnl; vagina-2-3/4 cystocele and 2/4 rectocele; uterus-normal size with 2/4 descensus, cervix without lesions or motion tenderness; adnexae-no tenderness or masses Extremities:  no clubbing, cyanosis or edema   Assesment:  Menorrhagia  Symptomatic Uterine Fibroid            Symptomatic Pelvic Relaxation   Disposition:  A discussion was held with patient regarding the indication for her procedure(s) along with the risks, which include but are not limited to: reaction to anesthesia, damage to adjacent organs, infection and excessive bleeding.  The  patient verbalized understanding of these risks and has consented to proceed with a Total Vaginal Hysterectomy, Possible Bilateral Salpingectomy, Possible Bilateral Salpingo-oophorectomy, with Anterior-Posterior Colporrhaphy and Possible Total Abdominal Hysterectomy at [place] on October 13, 2014.   CSN# [id number]   Ananth Fiallos J. Florene Glen, PA-C  for Dr. Harvie Bridge. Mancel Bale

## 2014-10-13 NOTE — Interval H&P Note (Signed)
History and Physical Interval Note:  10/13/2014 12:17 PM  Jillian Dennis  has presented today for surgery, with the diagnosis of Menorrhagia  The various methods of treatment have been discussed with the patient and family. After consideration of risks, benefits and other options for treatment, the patient has consented to  Procedure(s): TOTAL VAGINAL HYSTERECTOMY BILATERAL SALPINGECTOMY  (Bilateral) POSSIBLE LAPAROSCOPIC ASSISTED VAGINAL HYSTERECTOMY (N/A) POSSIBLE TOTAL ABDOMINAL HYSTERECTOMY WITH  (N/A) CYSTOSCOPY (N/A) as a surgical intervention .  The patient's history has been reviewed, patient examined, no change in status, stable for surgery.  I have reviewed the patient's chart and labs.  Questions were answered to the patient's satisfaction.  I discussed at length the patient's history and family history as well as the risks/benefits and alternatives of removal of ovaries on the phone last week and she has decided to keep her ovaries and confirms today, the day of surgery, that she would like to keep her ovaries.   Delice Lesch

## 2014-10-13 NOTE — Anesthesia Procedure Notes (Signed)
Procedure Name: Intubation Date/Time: 10/13/2014 12:52 PM Performed by: Tobin Chad Pre-anesthesia Checklist: Patient identified, Timeout performed, Emergency Drugs available, Suction available and Patient being monitored Patient Re-evaluated:Patient Re-evaluated prior to inductionOxygen Delivery Method: Circle system utilized Preoxygenation: Pre-oxygenation with 100% oxygen Intubation Type: IV induction Ventilation: Mask ventilation without difficulty Laryngoscope Size: Mac and 3 Grade View: Grade III Tube type: Oral Tube size: 7.0 mm Number of attempts: 1 Placement Confirmation: ETT inserted through vocal cords under direct vision Secured at: 21 cm Tube secured with: Tape

## 2014-10-14 ENCOUNTER — Observation Stay (HOSPITAL_COMMUNITY): Payer: BC Managed Care – PPO

## 2014-10-14 ENCOUNTER — Encounter (HOSPITAL_COMMUNITY): Payer: Self-pay | Admitting: Obstetrics and Gynecology

## 2014-10-14 DIAGNOSIS — N920 Excessive and frequent menstruation with regular cycle: Secondary | ICD-10-CM | POA: Diagnosis not present

## 2014-10-14 LAB — CBC
HEMATOCRIT: 28.7 % — AB (ref 36.0–46.0)
HEMATOCRIT: 28.9 % — AB (ref 36.0–46.0)
HEMOGLOBIN: 9.4 g/dL — AB (ref 12.0–15.0)
HEMOGLOBIN: 9.5 g/dL — AB (ref 12.0–15.0)
MCH: 27.9 pg (ref 26.0–34.0)
MCH: 28.3 pg (ref 26.0–34.0)
MCHC: 32.8 g/dL (ref 30.0–36.0)
MCHC: 32.9 g/dL (ref 30.0–36.0)
MCV: 85.2 fL (ref 78.0–100.0)
MCV: 86 fL (ref 78.0–100.0)
PLATELETS: 203 10*3/uL (ref 150–400)
Platelets: 157 10*3/uL (ref 150–400)
RBC: 3.37 MIL/uL — ABNORMAL LOW (ref 3.87–5.11)
RBC: 3.36 MIL/uL — ABNORMAL LOW (ref 3.87–5.11)
RDW: 13.8 % (ref 11.5–15.5)
RDW: 13.8 % (ref 11.5–15.5)
WBC: 8.2 10*3/uL (ref 4.0–10.5)
WBC: 6.9 10*3/uL (ref 4.0–10.5)

## 2014-10-14 LAB — CBC WITH DIFFERENTIAL/PLATELET
Basophils Absolute: 0 10*3/uL (ref 0.0–0.1)
Basophils Relative: 0 % (ref 0–1)
Eosinophils Absolute: 0.1 10*3/uL (ref 0.0–0.7)
Eosinophils Relative: 1 % (ref 0–5)
HEMATOCRIT: 28.1 % — AB (ref 36.0–46.0)
HEMOGLOBIN: 9.3 g/dL — AB (ref 12.0–15.0)
LYMPHS PCT: 26 % (ref 12–46)
Lymphs Abs: 1.6 10*3/uL (ref 0.7–4.0)
MCH: 28.9 pg (ref 26.0–34.0)
MCHC: 33.1 g/dL (ref 30.0–36.0)
MCV: 87.3 fL (ref 78.0–100.0)
MONO ABS: 0.4 10*3/uL (ref 0.1–1.0)
MONOS PCT: 7 % (ref 3–12)
NEUTROS ABS: 3.9 10*3/uL (ref 1.7–7.7)
Neutrophils Relative %: 66 % (ref 43–77)
Platelets: 167 10*3/uL (ref 150–400)
RBC: 3.22 MIL/uL — ABNORMAL LOW (ref 3.87–5.11)
RDW: 13.8 % (ref 11.5–15.5)
WBC: 6 10*3/uL (ref 4.0–10.5)

## 2014-10-14 LAB — BASIC METABOLIC PANEL
Anion gap: 7 (ref 5–15)
BUN: 6 mg/dL (ref 6–23)
CHLORIDE: 103 meq/L (ref 96–112)
CO2: 29 mEq/L (ref 19–32)
CREATININE: 0.88 mg/dL (ref 0.50–1.10)
Calcium: 8 mg/dL — ABNORMAL LOW (ref 8.4–10.5)
GFR calc Af Amer: 90 mL/min — ABNORMAL LOW (ref >90)
GFR calc non Af Amer: 78 mL/min — ABNORMAL LOW (ref >90)
Glucose, Bld: 99 mg/dL (ref 70–99)
Potassium: 3.8 mEq/L (ref 3.7–5.3)
Sodium: 139 mEq/L (ref 137–147)

## 2014-10-14 MED ORDER — IOHEXOL 350 MG/ML SOLN
100.0000 mL | Freq: Once | INTRAVENOUS | Status: AC | PRN
  Administered 2014-10-14: 100 mL via INTRAVENOUS

## 2014-10-14 MED ORDER — IBUPROFEN 600 MG PO TABS: ORAL_TABLET | ORAL | Status: DC

## 2014-10-14 MED ORDER — LACTATED RINGERS IV SOLN
INTRAVENOUS | Status: DC
  Administered 2014-10-14 – 2014-10-15 (×2): via INTRAVENOUS

## 2014-10-14 MED ORDER — SODIUM CHLORIDE 0.9 % IV SOLN
3.0000 g | Freq: Four times a day (QID) | INTRAVENOUS | Status: AC
  Administered 2014-10-14 – 2014-10-15 (×4): 3 g via INTRAVENOUS
  Filled 2014-10-14 (×4): qty 3

## 2014-10-14 MED ORDER — OXYCODONE-ACETAMINOPHEN 5-325 MG PO TABS: 1.0000 | ORAL_TABLET | Freq: Four times a day (QID) | ORAL | Status: DC | PRN

## 2014-10-14 MED ORDER — IOHEXOL 300 MG/ML  SOLN
50.0000 mL | INTRAMUSCULAR | Status: AC
  Administered 2014-10-14 (×2): 50 mL via ORAL

## 2014-10-14 NOTE — Addendum Note (Signed)
Addendum  created 10/14/14 1012 by Lenox Ponds, CRNA   Modules edited: Notes Section   Notes Section:  File: [id number]

## 2014-10-14 NOTE — Progress Notes (Signed)
Jillian Dennis is a41 y.o.  [id number]  Post Op Date # 1: TVH/Posterior Repair/BS/Cystoscopy Subjective: Patient is Doing well postoperatively. Patient has The patient is not having any pain. Ambulating in the halls without difficulty, tolerating liquids but hasn't voided yet (Foley just removed).  Hasn't passed flatus.   Objective: Vital signs in last 24 hours: Temp:  [97.9 F (36.6 C)-99.2 F (37.3 C)] 97.9 F (36.6 C) (12/15 0544) Pulse Rate:  [56-68] 59 (12/15 0544) Resp:  [13-19] 18 (12/15 0544) BP: (86-129)/(49-82) 86/49 mmHg (12/15 0544) SpO2:  [95 %-100 %] 99 % (12/15 0544) Weight:  [145 lb (65.772 kg)] 145 lb (65.772 kg) (12/14 2135)  Intake/Output from previous day: 12/14 0701 - 12/15 0700 In: 5017.5 [P.O.:1880; I.V.:3137.5] Out: 3050 [Urine:2700] Intake/Output this shift: Total I/O In: 3317.5 [P.O.:1880; I.V.:1437.5] Out: 1600 [Urine:1600]  Recent Labs Lab 10/14/14 0525  WBC 8.2  HGB 9.4*  HCT 28.7*  PLT 203    No results for input(s): NA, K, CL, CO2, BUN, CREATININE, CALCIUM, PROT, BILITOT, ALKPHOS, ALT, AST, GLUCOSE in the last 168 hours.  Invalid input(s): LABALBU  EXAM: General: alert, cooperative and no distress Resp: clear to auscultation bilaterally Cardio: regular rate and rhythm, S1, S2 normal, no murmur, click, rub or gallop GI: Bowel sounds present, soft, appropriately tender with deep palpation. Extremities: SCD hose in place and functioning; no calf tenderness.   Assessment: s/p Procedure(s): TOTAL VAGINAL HYSTERECTOMY BILATERAL SALPINGECTOMY WITH POSTERIOR REPAIR, CYSTOSCOPY: stable, progressing well and anemia  Plan: Advance diet Advance to PO medication Awating patient to void   Probable discharge home today  LOS: 1 day    Dennis,ELMIRA, PA-C 10/14/2014 6:27 AM  Pt seen at about 1p and BP noted to be low 80s/60s.  Pt said she felt a little dizzy earlier with ambulation but otherwise she felt ok.  A CBC was ordered and RN  notified me that Hgb was stable but pt now with temp of 101.2 and O2 sat of 93%.  RN had pt cough and deep breath and O2 sat went to 96%.  Pt seen at about 6p.  Good BS noted, denies flatus, nausea or excess pain.  Pt said she ambulated and no longer felt dizzy and feels better than she did earlier today.  If spikes another temp, will start antibiotics and for now will restart IV fluids, check blood cultures, order CT to eval for PE or pelvic abscess and urine culture.  Pt has been using incentive spirometer.  There is also no calf tenderness on exam.   Pt is voiding wihtout difficulty and has minimal vaginal bleeding.

## 2014-10-14 NOTE — Anesthesia Postprocedure Evaluation (Signed)
  Anesthesia Post-op Note  Patient: Jillian Dennis  Procedure(s) Performed: Procedure(s): TOTAL VAGINAL HYSTERECTOMY BILATERAL SALPINGECTOMY WITH POSTERIOR REPAIR, CYSTOSCOPY (Bilateral)  Patient Location: [facility]  Anesthesia Type:General  Level of Consciousness: awake, alert  and oriented  Airway and Oxygen Therapy: Patient Spontanous Breathing  Post-op Pain: mild  Post-op Assessment: Post-op Vital signs reviewed, Patient's Cardiovascular Status Stable, Respiratory Function Stable, Patent Airway, No signs of Nausea or vomiting and Pain level controlled  Post-op Vital Signs: Reviewed and stable  Last Vitals:  Filed Vitals:   10/14/14 0544  BP: 86/49  Pulse: 59  Temp: 36.6 C  Resp: 18    Complications: No apparent anesthesia complications

## 2014-10-14 NOTE — Discharge Summary (Signed)
Physician Discharge Summary  Patient ID: Jillian Dennis MRN: [id number] DOB/AGE: 03-03-1966 48 y.o.  Admit date: 10/13/2014 Discharge date: 10/14/2014   Discharge Diagnoses: Symptomatic Uterine Fibroid, Menorrhagia and Pelvic Relaxation Active Problems:   Menorrhagia   Operation: Total Vaginal Hysterectomy, Bilateral Salpingectomy, Posterior Repair and Cystoscopy   Discharged Condition: Good  Hospital Course: On the date of admission the patient underwent the aforementioned procedures and tolerated them well.  Disposition: Home to self care  Discharge Medications:    Medication List    TAKE these medications        CALCIUM 1200 1200-1000 MG-UNIT Chew  Chew 1 tablet by mouth daily.     ibuprofen 600 MG tablet  Commonly known as:  ADVIL,MOTRIN  1 po  pc every 6 hours x 5 days then prn-pain     multivitamin with minerals tablet  Take 1 tablet by mouth daily.     oxyCODONE-acetaminophen 5-325 MG per tablet  Commonly known as:  PERCOCET/ROXICET  Take 1-2 tablets by mouth every 6 (six) hours as needed for severe pain (moderate to severe pain (when tolerating fluids)).        Integra Plus 1 capsule every day and Colace 1 tablet twice a day      Follow-up: Dr. Harvie Bridge. Mancel Bale on November 21, 2013 at 9:30 a.m.   SignedEarnstine Regal, PA-C 10/14/2014, 6:37 AM

## 2014-10-15 DIAGNOSIS — N920 Excessive and frequent menstruation with regular cycle: Secondary | ICD-10-CM | POA: Diagnosis not present

## 2014-10-15 LAB — CBC WITH DIFFERENTIAL/PLATELET
BASOS ABS: 0 10*3/uL (ref 0.0–0.1)
Basophils Relative: 0 % (ref 0–1)
Eosinophils Absolute: 0.1 10*3/uL (ref 0.0–0.7)
Eosinophils Relative: 2 % (ref 0–5)
HEMATOCRIT: 25.8 % — AB (ref 36.0–46.0)
Hemoglobin: 8.3 g/dL — ABNORMAL LOW (ref 12.0–15.0)
LYMPHS PCT: 28 % (ref 12–46)
Lymphs Abs: 1.7 10*3/uL (ref 0.7–4.0)
MCH: 28.2 pg (ref 26.0–34.0)
MCHC: 32.2 g/dL (ref 30.0–36.0)
MCV: 87.8 fL (ref 78.0–100.0)
Monocytes Absolute: 0.4 10*3/uL (ref 0.1–1.0)
Monocytes Relative: 7 % (ref 3–12)
Neutro Abs: 3.8 10*3/uL (ref 1.7–7.7)
Neutrophils Relative %: 63 % (ref 43–77)
PLATELETS: 149 10*3/uL — AB (ref 150–400)
RBC: 2.94 MIL/uL — ABNORMAL LOW (ref 3.87–5.11)
RDW: 13.9 % (ref 11.5–15.5)
WBC: 6 10*3/uL (ref 4.0–10.5)

## 2014-10-15 MED ORDER — AMPICILLIN-SULBACTAM SODIUM 3 (2-1) G IJ SOLR
3.0000 g | Freq: Once | INTRAMUSCULAR | Status: AC
  Administered 2014-10-16: 3 g via INTRAVENOUS
  Filled 2014-10-15: qty 3

## 2014-10-15 MED ORDER — SODIUM CHLORIDE 0.9 % IV SOLN
3.0000 g | Freq: Once | INTRAVENOUS | Status: AC
  Administered 2014-10-16: 3 g via INTRAVENOUS
  Filled 2014-10-15: qty 3

## 2014-10-15 MED ORDER — FERROUS SULFATE 325 (65 FE) MG PO TABS
325.0000 mg | ORAL_TABLET | Freq: Two times a day (BID) | ORAL | Status: DC
  Administered 2014-10-15 – 2014-10-16 (×3): 325 mg via ORAL
  Filled 2014-10-15 (×3): qty 1

## 2014-10-15 NOTE — Plan of Care (Signed)
Problem: Phase I Progression Outcomes Goal: IS, TCDB as ordered Outcome: Completed/Met Date Met:  10/15/14 IS TCDB q1h

## 2014-10-15 NOTE — Progress Notes (Signed)
UR chart review completed.  

## 2014-10-15 NOTE — Progress Notes (Addendum)
Jillian Dennis is a48 y.o.  [id number]  Post Op Date # 2:  TVH/BS/Posterior Repair/Cystoscopy  Subjective: Patient is clinically doing well post op.  Current temperature is normal though H/H decraseing (perhaps due to dilution). Patient has minimal pain,  3/10 on a 10 point pain scale that is crampy but no other complaints.  Denies any dizziness or lightheadedness with ambulation.  Bowel and bladder function remain normal.    Objective: Vital signs in last 24 hours: Temp:  [98.6 F (37 C)-101.2 F (38.4 C)] 99 F (37.2 C) (12/16 0428) Pulse Rate:  [60-81] 71 (12/16 0428) Resp:  [16-18] 16 (12/16 0428) BP: (80-94)/(40-52) 80/50 mmHg (12/16 0428) SpO2:  [89 %-98 %] 97 % (12/16 0555)  Intake/Output from previous day: 12/15 0701 - 12/16 0700 In: 4135 [P.O.:1920; I.V.:2015] Out: 5500 [Urine:5500] Intake/Output this shift:    Recent Labs Lab 10/14/14 1318 10/14/14 1920 10/15/14 0512  WBC 6.9 6.0 6.0  HGB 9.5* 9.3* 8.3*  HCT 28.9* 28.1* 25.8*  PLT 157 167 149*     Recent Labs Lab 10/14/14 1920  NA 139  K 3.8  CL 103  CO2 29  BUN 6  CREATININE 0.88  CALCIUM 8.0*  GLUCOSE 99    EXAM: General: alert, cooperative and no distress Resp: clear to auscultation bilaterally and normal percussion bilaterally Cardio: regular rate and rhythm, S1, S2 normal, no murmur, click, rub or gallop GI: Bowel sounds present, soft Extremities: no calf tenderness.   Assessment: s/p Procedure(s): TOTAL VAGINAL HYSTERECTOMY BILATERAL SALPINGECTOMY WITH POSTERIOR REPAIR, CYSTOSCOPY: stable, anemia and fever resolved.  Plan: Encourage ambulation Discontinue IV fluids Begin FeSO4  325 mg  bid  Continue Colace 100 mg  bid Routine care  LOS: 2 days    POWELL,ELMIRA, PA-C 10/15/2014 7:36 AM  Pt seen a lunch time and again this evening at approx 5pm.  She had reported feeling better.  She is passing flatus, tolerating po without n/v, ambulating without lightheadedness or dizziness.   She continues to deny SOB and never has reported any SOB even though her O2 sat was in the 80's.  Chest CT did not reveal a PE and all it revealed was segmental bilateral atelectasis.  Pelvic CT done secondary to unexplained fever was negative for any collections.  Vigorous IS was done last night and the O2 sats improved.  She was on O2 overnight.  Pt was started on antibiotics yesterday evening after having fever to 101.2 and then 100.9.  WBC has remained normal without a left shift.  Without other etiologies of the fever, I suspect the desaturation as well as the fever are all related to the large amount of bilateral atelectasis.  Will cont to observe pt overnight.  May d/c antibiotics after having a normal temp for 24hrs as a precaution.

## 2014-10-16 DIAGNOSIS — N920 Excessive and frequent menstruation with regular cycle: Secondary | ICD-10-CM | POA: Diagnosis not present

## 2014-10-16 LAB — CBC WITH DIFFERENTIAL/PLATELET
BASOS PCT: 0 % (ref 0–1)
Basophils Absolute: 0 10*3/uL (ref 0.0–0.1)
Eosinophils Absolute: 0.2 10*3/uL (ref 0.0–0.7)
Eosinophils Relative: 5 % (ref 0–5)
HCT: 25.8 % — ABNORMAL LOW (ref 36.0–46.0)
HEMOGLOBIN: 8.4 g/dL — AB (ref 12.0–15.0)
LYMPHS ABS: 1.3 10*3/uL (ref 0.7–4.0)
Lymphocytes Relative: 27 % (ref 12–46)
MCH: 28.5 pg (ref 26.0–34.0)
MCHC: 32.6 g/dL (ref 30.0–36.0)
MCV: 87.5 fL (ref 78.0–100.0)
MONO ABS: 0.3 10*3/uL (ref 0.1–1.0)
Monocytes Relative: 7 % (ref 3–12)
Neutro Abs: 2.9 10*3/uL (ref 1.7–7.7)
Neutrophils Relative %: 61 % (ref 43–77)
Platelets: 127 10*3/uL — ABNORMAL LOW (ref 150–400)
RBC: 2.95 MIL/uL — ABNORMAL LOW (ref 3.87–5.11)
RDW: 13.5 % (ref 11.5–15.5)
WBC: 4.8 10*3/uL (ref 4.0–10.5)

## 2014-10-16 LAB — URINE CULTURE

## 2014-10-16 LAB — CBC
HEMATOCRIT: 26.4 % — AB (ref 36.0–46.0)
HEMOGLOBIN: 8.8 g/dL — AB (ref 12.0–15.0)
MCH: 28.8 pg (ref 26.0–34.0)
MCHC: 33.3 g/dL (ref 30.0–36.0)
MCV: 86.3 fL (ref 78.0–100.0)
Platelets: 151 10*3/uL (ref 150–400)
RBC: 3.06 MIL/uL — ABNORMAL LOW (ref 3.87–5.11)
RDW: 13.5 % (ref 11.5–15.5)
WBC: 4.7 10*3/uL (ref 4.0–10.5)

## 2014-10-16 MED ORDER — INTEGRA PLUS PO CAPS: 1.0000 | ORAL_CAPSULE | Freq: Every day | ORAL | Status: DC

## 2014-10-16 NOTE — Progress Notes (Signed)
Jillian Dennis is a51 y.o.  [id number]  Post Op Date # TVH/BS/Posterior Repair/Cystoscopy Subjective: Patient is reporting that she feels much better. Patient has some vaginal pain rated at 5/10 on a 10 point pain scale with some relief from oral analgesia.  ([place]). Tolerating a regular diet, ambulating, and voiding without difficulty.   Objective: Vital signs in last 24 hours: Temp:  [98.4 F (36.9 C)-99.4 F (37.4 C)] 98.4 F (36.9 C) (12/17 0816) Pulse Rate:  [55-70] 66 (12/17 0500) Resp:  [18] 18 (12/17 0500) BP: (84-99)/(55-61) 93/61 mmHg (12/17 0500) SpO2:  [93 %-99 %] 93 % (12/17 0500)  Intake/Output from previous day: 12/16 0701 - 12/17 0700 In: 720 [P.O.:720] Out: 2600 [Urine:2600] Intake/Output this shift:    Recent Labs Lab 10/14/14 1920 10/15/14 0512 10/16/14 0528  WBC 6.0 6.0 4.8  HGB 9.3* 8.3* 8.4*  HCT 28.1* 25.8* 25.8*  PLT 167 149* 127*     Recent Labs Lab 10/14/14 1920  NA 139  K 3.8  CL 103  CO2 29  BUN 6  CREATININE 0.88  CALCIUM 8.0*  GLUCOSE 99    EXAM: General: alert, cooperative and no distress Resp: clear to auscultation bilaterally and normal percussion bilaterally Cardio: regular rate and rhythm, S1, S2 normal, no murmur, click, rub or gallop GI: Bowel sounds present and soft Extremities: No calf tenderness and negative Homan's sign   Assessment: s/p Procedure(s): TOTAL VAGINAL HYSTERECTOMY BILATERAL SALPINGECTOMY WITH POSTERIOR REPAIR, CYSTOSCOPY: stable and progressing well  Plan: Routine care  Increase dose of Percocet to #2 tablets in an effort to achieve better pain management  LOS: 3 days    POWELL,ELMIRA, PA-C 10/16/2014 8:54 AM   Pt says she feels well and ready to go home.  I am concerned that her temp was still 99.4 at 5am and platelets were low on cbc this morning.  Last dose of unasyn was at 6am.  UCx was neg, Blood Cx x 2 neg, no evidence of infection based on WBC, hgb stable and BP improved.   Overall the pt is clinically doing well and continues to deny light headedness or dizziness with ambulation and only dark spotting at the most on her pad.  Pain is well controlled on percocet.  I discussed keeping her overnight to watch her until she is 24hrs afebrile off abx.  She would really like to go home today.  I will recheck CBC at 5pm to make sure a consumptive process is not occuring and recheck additional vitals and if all looks good, will discharge home this evening.  D/C instructions reviewed.

## 2014-10-16 NOTE — Progress Notes (Signed)
Pt discharged home with husband... Discharge instructions reviewed with pt and she verbalized understanding... Condition stable... No equipment... Ambulated to car with J. Bass, NT.  

## 2014-10-20 LAB — CULTURE, BLOOD (ROUTINE X 2)
CULTURE: NO GROWTH
Culture: NO GROWTH

## 2014-10-22 ENCOUNTER — Ambulatory Visit (INDEPENDENT_AMBULATORY_CARE_PROVIDER_SITE_OTHER): Payer: BC Managed Care – PPO | Admitting: Internal Medicine

## 2014-10-22 ENCOUNTER — Encounter: Payer: Self-pay | Admitting: Gynecology

## 2014-10-22 ENCOUNTER — Encounter: Payer: Self-pay | Admitting: Internal Medicine

## 2014-10-22 ENCOUNTER — Ambulatory Visit: Payer: BC Managed Care – PPO | Attending: Gynecology | Admitting: Gynecology

## 2014-10-22 VITALS — BP 112/84 | HR 70 | Ht 64.0 in | Wt 146.0 lb

## 2014-10-22 VITALS — BP 136/71 | HR 73 | Temp 98.4°F | Resp 18 | Ht 64.0 in | Wt 145.3 lb

## 2014-10-22 DIAGNOSIS — D495 Neoplasm of unspecified behavior of other genitourinary organs: Secondary | ICD-10-CM | POA: Diagnosis not present

## 2014-10-22 DIAGNOSIS — Z90710 Acquired absence of both cervix and uterus: Secondary | ICD-10-CM | POA: Diagnosis not present

## 2014-10-22 DIAGNOSIS — Z9079 Acquired absence of other genital organ(s): Secondary | ICD-10-CM | POA: Insufficient documentation

## 2014-10-22 DIAGNOSIS — E785 Hyperlipidemia, unspecified: Secondary | ICD-10-CM | POA: Insufficient documentation

## 2014-10-22 DIAGNOSIS — J9601 Acute respiratory failure with hypoxia: Secondary | ICD-10-CM

## 2014-10-22 DIAGNOSIS — R911 Solitary pulmonary nodule: Secondary | ICD-10-CM

## 2014-10-22 DIAGNOSIS — D3910 Neoplasm of uncertain behavior of unspecified ovary: Secondary | ICD-10-CM

## 2014-10-22 DIAGNOSIS — Z8041 Family history of malignant neoplasm of ovary: Secondary | ICD-10-CM | POA: Diagnosis not present

## 2014-10-22 NOTE — Patient Instructions (Signed)
Preparing for your Surgery  Plan for surgery on Feb 2 with Dr. Everitt Amber.  Pre-operative Testing -You will receive a phone call from presurgical testing at [facility] to arrange for a pre-operative testing appointment before your surgery.  This appointment normally occurs one to two weeks before your scheduled surgery.   -Bring your insurance card, copy of an advanced directive if applicable, medication list  -At that visit, you will be asked to sign a consent for a possible blood transfusion in case a transfusion becomes necessary during surgery.  The need for a blood transfusion is rare but having consent is a necessary part of your care.     -You should not be taking blood thinners or aspirin at least ten days prior to surgery unless instructed by your surgeon.  Day Before Surgery at [place] will be asked to take in only clear liquids the day before surgery.  Examples of clear liquids include broths, jello, and clear juices.  You will be advised to have nothing to eat or drink after midnight the evening before.    Your role in recovery Your role is to become active as soon as directed by your doctor, while still giving yourself time to heal.  Rest when you feel tired. You will be asked to do the following in order to speed your recovery:  - Cough and breathe deeply. This helps toclear and expand your lungs and can prevent pneumonia. You may be given a spirometer to practice deep breathing. A staff member will show you how to use the spirometer. - Do mild physical activity. Walking or moving your legs help your circulation and body functions return to normal. A staff member will help you when you try to walk and will provide you with simple exercises. Do not try to get up or walk alone the first time. - Actively manage your pain. Managing your pain lets you move in comfort. We will ask you to rate your pain on a scale of zero to 10. It is your responsibility to tell  your doctor or nurse where and how much you hurt so your pain can be treated.  Special Considerations -If you are diabetic, you may be placed on insulin after surgery to have closer control over your blood sugars to promote healing and recovery.  This does not mean that you will be discharged on insulin.  If applicable, your oral antidiabetics will be resumed when you are tolerating a solid diet.  -Your final pathology results from surgery should be available by the Friday after surgery and the results will be relayed to you when available.

## 2014-10-22 NOTE — Progress Notes (Signed)
   Subjective:    Patient ID: Jillian Dennis, female    DOB: 06/21/1966   MRN: [id number]  HPI  17 yowf never smoker /no passive exp with good ex tol > hysterectomy > desats post op x 2 days > CT chest pos atx bases plus RML 6 mm nodule so referred to [facility] 10/22/14 by Jillian Dennis    10/22/2014 1st [facility] office visit/ Jillian Dennis   Chief Complaint  Patient presents with  . Pulmonary Consult    Referred by Dr. Everett Dennis. Pt states that after having hysterectomy on 10/13/14 she was advised her o2 sats dropped. CT chest was then performed 10/14/14 that was sig for pulmonary nodule.    feels has completely recovered s residual sob though not aerobic yet  No obvious   day to day or daytime variabilty or assoc chronic cough or cp or chest tightness, subjective wheeze overt sinus or hb symptoms. No unusual exp hx or h/o childhood pna/ asthma or knowledge of premature birth.  Sleeping ok without nocturnal  or early am exacerbation  of respiratory  c/o's or need for noct saba. Also denies any obvious fluctuation of symptoms with weather or environmental changes or other aggravating or alleviating factors except as outlined above   Current Medications, Allergies, Complete Past Medical History, Past Surgical History, Family History, and Social History were reviewed in [employer] record.              Review of Systems  Constitutional: Negative for fever, chills and unexpected weight change.  HENT: Negative for congestion, dental problem, ear pain, nosebleeds, postnasal drip, rhinorrhea, sinus pressure, sneezing, sore throat, trouble swallowing and voice change.   Eyes: Negative for visual disturbance.  Respiratory: Negative for cough, choking and shortness of breath.   Cardiovascular: Negative for chest pain and leg swelling.  Gastrointestinal: Negative for vomiting, abdominal pain and diarrhea.  Genitourinary: Negative for difficulty urinating.    Musculoskeletal: Negative for arthralgias.  Skin: Negative for rash.  Neurological: Positive for headaches. Negative for tremors and syncope.  Hematological: Does not bruise/bleed easily.       Objective:   Physical Exam Pleasant amb healthy appearing wf nad  Wt Readings from Last 3 Encounters:  10/22/14 146 lb (66.225 kg)  10/22/14 145 lb 4.8 oz (65.908 kg)  10/13/14 145 lb (65.772 kg)    Vital signs reviewed  HEENT: nl dentition, turbinates, and orophanx. Nl external ear canals without cough reflex   NECK :  without JVD/Nodes/TM/ nl carotid upstrokes bilaterally   LUNGS: no acc muscle use, clear to A and P bilaterally without cough on insp or exp maneuvers   CV:  RRR  no s3 or murmur or increase in P2, no edema   ABD:  soft and nontender with nl excursion in the supine position. No bruits or organomegaly, bowel sounds nl  MS:  warm without deformities, calf tenderness, cyanosis or clubbing  SKIN: warm and dry without lesions    NEURO:  alert, approp, no deficits    CTa 10/14/14 1. Negative for pulmonary embolism. 2. Bibasilar atelectasis. 3. Expected postoperative appearance of the pelvis. 4. 4 mm pulmonary nodule in the right middle lobe. If the patient is at high risk for bronchogenic carcinoma, follow-up chest CT at 1 year is recommended. If the patient is at low risk, no follow-up is needed.        Assessment & Plan:

## 2014-10-22 NOTE — Patient Instructions (Signed)
The earliest I would repeat a Ct would be 04/2015 > in computer for recall  The way to avoid post op complications 1) mobilize as soon as tolerated  2) Incentive spirometry as much as possible  3) Minimize post op sedation and pain meds

## 2014-10-22 NOTE — Progress Notes (Signed)
Consult Note: Gyn-Onc   Adela Glimpse 48 y.o. female  Chief Complaint  Patient presents with  . 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 Family history of ovarian cancer.  Plan: The patient's operative note pathology report and CT scan were reviewed.   I had a lengthy discussion with the patient and her husband regarding the surgical findings and pathology report from the vaginal hysterectomy and bilateral salpingectomy performed on 10/13/2014. We discussed the theoretical possibility that some ovarian cancers actually arise from the fallopian tube. We also discussed the fact that borderline tumors of fallopian tube are exceedingly rare and probably have a good prognosis. However, I would recommend that the patient undergo surgical exploration using minimally invasive techniques to include: Peritoneal cytology, intraperitoneal exploration, and bilateral oophorectomy. Should no additional disease be found, I would not anticipate any additional therapy. On the other hand, a remote chance of identifying a malignancy in the ovaries or peritoneal cavity exists and the patient indicates that she would prefer to undergo complete surgical staging at that time based on frozen section reports.  We will have the patient return to meet and discuss with Dr. Everitt Amber and schedule surgery for approximately 6 weeks from now. Surgery is scheduled for 12/02/2014.  HPI: 48 year old white married female gravida 2 seen in consultation request of Dr. 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 The patient underwent a vaginal hysterectomy and bilateral salpingectomy on 10/13/2014 for treatment of menorrhagia. The patient's postoperative course was uncomplicated. Final pathology showed a 3 mm serous borderline tumor in the fallopian tube. Both fallopian tubes were submitted together and therefore the laterality of  the tumor is not known. Postoperatively, the patient had some dyspnea and underwent a CT scan to rule out pulmonary embolus. The only significant finding were 2 small pulmonary nodules in the right middle lobe measuring 5 and 4 mm. The patient is scheduled to see a pulmonologist later today.  The patient has a family history of a paternal aunt with ovarian cancer.  Review of Systems:10 point review of systems is negative except as noted in interval history.   Vitals: Blood pressure 136/71, pulse 73, temperature 98.4 F (36.9 C), resp. rate 18, height 5\' 4"  (1.626 m), weight 145 lb 4.8 oz (65.908 kg), last menstrual period 09/21/2014.  Physical Exam: General : The patient is a healthy woman in no acute distress.  Remainder the exam is deferred because the patient's immediate postoperative state.      No Known Allergies  Past Medical History  Diagnosis Date  . SVD (spontaneous vaginal delivery)     x 2  . Hyperlipidemia     diet and exercise controlled - no med    Past Surgical History  Procedure Laterality Date  . Tonsillectomy    . Wisdom tooth extraction    . Vaginal hysterectomy Bilateral 10/13/2014    Procedure: TOTAL VAGINAL HYSTERECTOMY BILATERAL SALPINGECTOMY WITH POSTERIOR REPAIR, CYSTOSCOPY;  Surgeon: Delice Lesch, MD;  Location: [facility];  Service: Gynecology;  Laterality: Bilateral;    Current Outpatient Prescriptions  Medication Sig Dispense Refill  . Calcium Carbonate-Vit D-Min (CALCIUM 1200) 1200-1000 MG-UNIT CHEW Chew 1 tablet by mouth daily.    Marland Kitchen FeFum-FePoly-FA-B Cmp-C-Biot (INTEGRA PLUS) CAPS Take 1 capsule by mouth daily. 60 capsule 1  . ibuprofen (ADVIL,MOTRIN) 600 MG tablet 1 po  pc every 6 hours x 5 days then prn-pain 30 tablet  1  . Multiple Vitamins-Minerals (MULTIVITAMIN WITH MINERALS) tablet Take 1 tablet by mouth daily.    Marland Kitchen oxyCODONE-acetaminophen (PERCOCET/ROXICET) 5-325 MG per tablet Take 1-2 tablets by mouth every 6 (six) hours as needed for  severe pain (moderate to severe pain (when tolerating fluids)). 30 tablet 0   No current facility-administered medications for this visit.    History   Social History  . Marital Status: Married    Spouse Name: N/A    Number of Children: N/A  . Years of Education: N/A   Occupational History  . Not on file.   Social History Main Topics  . Smoking status: Never Smoker   . Smokeless tobacco: Never Used  . Alcohol Use: Yes     Comment: rare  . Drug Use: No  . Sexual Activity: Yes    Birth Control/ Protection: Condom   Other Topics Concern  . Not on file   Social History Narrative    History reviewed. No pertinent family history.    CLARKE-PEARSON,Rafik Koppel L, MD 10/22/2014, 9:20 AM

## 2014-10-23 ENCOUNTER — Encounter: Payer: Self-pay | Admitting: Internal Medicine

## 2014-10-23 DIAGNOSIS — J9600 Acute respiratory failure, unspecified whether with hypoxia or hypercapnia: Secondary | ICD-10-CM | POA: Insufficient documentation

## 2014-10-23 DIAGNOSIS — R918 Other nonspecific abnormal finding of lung field: Secondary | ICD-10-CM | POA: Insufficient documentation

## 2014-10-23 NOTE — Assessment & Plan Note (Signed)
10/14/14 post op from hysterectomy - see CT 10/14/14 with bibasilar atx   Resolved now but Clearly related to v/q mismatch or even shunting due to post op atx, discussed ways to treat it as needs more surgery on fallopian tubes  See instructions for specific recommendations which were reviewed directly with the patient who was given a copy with highlighter outlining the key components.

## 2014-10-23 NOTE — Assessment & Plan Note (Addendum)
-   never smoker, no additional risk factors - See CT chest 10/14/14 > in tickle file for recall 04/15/15   CT scans/ results reviewed with pt >>> Too small for PET or bx, not suspicious enough for excisional bx > really only option for now is follow the Fleischner society guidelines as rec by radiology > as we did not see the entire lung due to atx, I rec she have a full CT chest at 6 m acknowledging up front that most likely she'll need at least one more to be able to declare this lesion benign, though this is by far the most likely outcome.

## 2014-10-27 ENCOUNTER — Ambulatory Visit: Payer: Managed Care, Other (non HMO) | Admitting: Gynecology

## 2014-11-07 ENCOUNTER — Encounter: Payer: Self-pay | Admitting: Gynecologic Oncology

## 2014-11-07 ENCOUNTER — Ambulatory Visit: Payer: BLUE CROSS/BLUE SHIELD | Attending: Gynecologic Oncology | Admitting: Gynecologic Oncology

## 2014-11-07 VITALS — BP 145/78 | HR 64 | Temp 98.3°F | Resp 16 | Ht 64.0 in | Wt 146.8 lb

## 2014-11-07 DIAGNOSIS — Z90710 Acquired absence of both cervix and uterus: Secondary | ICD-10-CM | POA: Insufficient documentation

## 2014-11-07 DIAGNOSIS — D495 Neoplasm of unspecified behavior of other genitourinary organs: Secondary | ICD-10-CM | POA: Diagnosis present

## 2014-11-07 DIAGNOSIS — Z8041 Family history of malignant neoplasm of ovary: Secondary | ICD-10-CM | POA: Diagnosis not present

## 2014-11-07 DIAGNOSIS — Z79899 Other long term (current) drug therapy: Secondary | ICD-10-CM | POA: Insufficient documentation

## 2014-11-07 DIAGNOSIS — E785 Hyperlipidemia, unspecified: Secondary | ICD-10-CM | POA: Diagnosis not present

## 2014-11-07 DIAGNOSIS — D3910 Neoplasm of uncertain behavior of unspecified ovary: Secondary | ICD-10-CM | POA: Insufficient documentation

## 2014-11-07 DIAGNOSIS — C5700 Malignant neoplasm of unspecified fallopian tube: Secondary | ICD-10-CM

## 2014-11-07 NOTE — Patient Instructions (Signed)
Preparing for your Surgery  Plan for surgery on February 2 with Dr. Denman George.  Pre-operative Testing -You will receive a phone call from presurgical testing at [facility] to arrange for a pre-operative testing appointment before your surgery.  This appointment normally occurs one to two weeks before your scheduled surgery.   -Bring your insurance card, copy of an advanced directive if applicable, medication list  -At that visit, you will be asked to sign a consent for a possible blood transfusion in case a transfusion becomes necessary during surgery.  The need for a blood transfusion is rare but having consent is a necessary part of your care.     -You should not be taking blood thinners or aspirin at least ten days prior to surgery unless instructed by your surgeon.  Day Before Surgery at [place] will be asked to take in only clear liquids the day before surgery.  Examples of clear liquids include broths, jello, and clear juices. You will be advised to have nothing to eat or drink after midnight the evening before.    Your role in recovery Your role is to become active as soon as directed by your doctor, while still giving yourself time to heal.  Rest when you feel tired. You will be asked to do the following in order to speed your recovery:  - Cough and breathe deeply. This helps toclear and expand your lungs and can prevent pneumonia. You may be given a spirometer to practice deep breathing. A staff member will show you how to use the spirometer. - Do mild physical activity. Walking or moving your legs help your circulation and body functions return to normal. A staff member will help you when you try to walk and will provide you with simple exercises. Do not try to get up or walk alone the first time. - Actively manage your pain. Managing your pain lets you move in comfort. We will ask you to rate your pain on a scale of zero to 10. It is your responsibility to tell  your doctor or nurse where and how much you hurt so your pain can be treated.  Special Considerations -If you are diabetic, you may be placed on insulin after surgery to have closer control over your blood sugars to promote healing and recovery.  This does not mean that you will be discharged on insulin.  If applicable, your oral antidiabetics will be resumed when you are tolerating a solid diet.  -Your final pathology results from surgery should be available by the Friday after surgery and the results will be relayed to you when available.

## 2014-11-07 NOTE — Progress Notes (Signed)
Consult Note: Gyn-Onc   Adela Glimpse 49 y.o. female  Chief Complaint  Patient presents with  . 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 Family history of ovarian cancer.  Plan: Robotic assisted bilateral salpingo-oophorectomy scheduled for 12/02/14. Frozen section of ovaries at time of surgery, and staging if occult malignancy is identified. Postoperatively she is interested in transdermal estrogen replacement. I discussed risks and benefits of HRT post-oophorectomy in a premenopausal woman. I am recommending HRT until age of natural menopause (approximately age 54).   HPI: 49 year old white married female gravida 2 seen in consultation request of Dr. 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 The patient underwent a vaginal hysterectomy and bilateral salpingectomy on 10/13/2014 for treatment of menorrhagia. The patient's postoperative course was uncomplicated. Final pathology showed a 3 mm serous borderline tumor in the fallopian tube. Both fallopian tubes were submitted together and therefore the laterality of the tumor is not known. Postoperatively, the patient had some dyspnea and underwent a CT scan to rule out pulmonary embolus. The only significant finding were 2 small pulmonary nodules in the right middle lobe measuring 5 and 4 mm. Pulmonology evaluation in December revealed no underlying pulmonary disease. Her CT showed no suspicious ovarian masses or signs of distant mets.  The patient has a family history of a paternal aunt with ovarian cancer.  Review of Systems:10 point review of systems is negative except as noted in interval history.   Vitals: Blood pressure 145/78, pulse 64, temperature 98.3 F (36.8 C), temperature source Oral, resp. rate 16, height 5\' 4"  (1.626 m), weight 146 lb 12.8 oz (66.588  kg).  Physical Exam: General : The patient is a healthy woman in no acute distress.  HEENT: no masses, normal thyroid Pulm: CTAB CVS: RRR Abdo: soft, nontender, no incisions, no masses, no fluid wave. Genitourinary/pelvic: deferred due to postop state Lymph nodes: no cervical or inguinal adenopathy Musculoskeletal: no deformities or edema.  No Known Allergies  Past Medical History  Diagnosis Date  . SVD (spontaneous vaginal delivery)     x 2  . Hyperlipidemia     diet and exercise controlled - no med    Past Surgical History  Procedure Laterality Date  . Tonsillectomy    . Wisdom tooth extraction    . Vaginal hysterectomy Bilateral 10/13/2014    Procedure: TOTAL VAGINAL HYSTERECTOMY BILATERAL SALPINGECTOMY WITH POSTERIOR REPAIR, CYSTOSCOPY;  Surgeon: Delice Lesch, MD;  Location: [facility];  Service: Gynecology;  Laterality: Bilateral;    Current Outpatient Prescriptions  Medication Sig Dispense Refill  . Calcium Carbonate-Vit D-Min (CALCIUM 1200) 1200-1000 MG-UNIT CHEW Chew 1 tablet by mouth daily.    Marland Kitchen FeFum-FePoly-FA-B Cmp-C-Biot (INTEGRA PLUS) CAPS Take 1 capsule by mouth daily. 60 capsule 1  . ibuprofen (ADVIL,MOTRIN) 600 MG tablet 1 po  pc every 6 hours x 5 days then prn-pain 30 tablet 1  . Multiple Vitamins-Minerals (MULTIVITAMIN WITH MINERALS) tablet Take 1 tablet by mouth daily.    Marland Kitchen oxyCODONE-acetaminophen (PERCOCET/ROXICET) 5-325 MG per tablet Take 1-2 tablets by mouth every 6 (six) hours as needed for severe pain (moderate to severe pain (when tolerating fluids)). (Patient not taking: Reported on 11/07/2014) 30 tablet 0   No current facility-administered medications for this visit.    History   Social History  . Marital Status: Married    Spouse Name: N/A  Number of Children: N/A  . Years of Education: N/A   Occupational History  . Not on file.   Social History Main Topics  . Smoking status: Never Smoker   . Smokeless tobacco: Never Used  . Alcohol  Use: Yes     Comment: rare  . Drug Use: No  . Sexual Activity: Yes    Birth Control/ Protection: Condom   Other Topics Concern  . Not on file   Social History Narrative    Family History  Problem Relation Age of Onset  . Heart disease Father   . Rheum arthritis Mother   . Lung cancer Maternal Grandfather     smoked  . Ovarian cancer Paternal Aunt   . Lung cancer Maternal Uncle     smoked      [name], MD 11/07/2014, 11:24 AM

## 2014-11-27 ENCOUNTER — Encounter (HOSPITAL_COMMUNITY)
Admission: RE | Admit: 2014-11-27 | Discharge: 2014-11-27 | Disposition: A | Discharge status: Home / Self Care | Payer: BLUE CROSS/BLUE SHIELD | Source: Ambulatory Visit | Attending: Gynecologic Oncology | Admitting: Gynecologic Oncology

## 2014-11-27 ENCOUNTER — Encounter (HOSPITAL_COMMUNITY): Payer: Self-pay

## 2014-11-27 DIAGNOSIS — D3910 Neoplasm of uncertain behavior of unspecified ovary: Secondary | ICD-10-CM | POA: Insufficient documentation

## 2014-11-27 DIAGNOSIS — Z01818 Encounter for other preprocedural examination: Secondary | ICD-10-CM | POA: Diagnosis not present

## 2014-11-27 HISTORY — DX: Adverse effect of unspecified anesthetic, initial encounter: T41.45XA

## 2014-11-27 HISTORY — DX: Other complications of anesthesia, initial encounter: T88.59XA

## 2014-11-27 LAB — COMPREHENSIVE METABOLIC PANEL
ALBUMIN: 4.3 g/dL (ref 3.5–5.2)
ALT: 18 U/L (ref 0–35)
AST: 20 U/L (ref 0–37)
Alkaline Phosphatase: 60 U/L (ref 39–117)
Anion gap: 6 (ref 5–15)
BILIRUBIN TOTAL: 0.4 mg/dL (ref 0.3–1.2)
BUN: 9 mg/dL (ref 6–23)
CO2: 27 mmol/L (ref 19–32)
Calcium: 9.3 mg/dL (ref 8.4–10.5)
Chloride: 108 mmol/L (ref 96–112)
Creatinine, Ser: 0.74 mg/dL (ref 0.50–1.10)
Glucose, Bld: 100 mg/dL — ABNORMAL HIGH (ref 70–99)
Potassium: 4.3 mmol/L (ref 3.5–5.1)
SODIUM: 141 mmol/L (ref 135–145)
Total Protein: 7.3 g/dL (ref 6.0–8.3)

## 2014-11-27 LAB — CBC WITH DIFFERENTIAL/PLATELET
Basophils Absolute: 0 10*3/uL (ref 0.0–0.1)
Basophils Relative: 0 % (ref 0–1)
EOS ABS: 0.1 10*3/uL (ref 0.0–0.7)
Eosinophils Relative: 2 % (ref 0–5)
HCT: 42.4 % (ref 36.0–46.0)
Hemoglobin: 14.4 g/dL (ref 12.0–15.0)
LYMPHS ABS: 1.6 10*3/uL (ref 0.7–4.0)
Lymphocytes Relative: 29 % (ref 12–46)
MCH: 29.1 pg (ref 26.0–34.0)
MCHC: 34 g/dL (ref 30.0–36.0)
MCV: 85.8 fL (ref 78.0–100.0)
Monocytes Absolute: 0.4 10*3/uL (ref 0.1–1.0)
Monocytes Relative: 7 % (ref 3–12)
Neutro Abs: 3.5 10*3/uL (ref 1.7–7.7)
Neutrophils Relative %: 62 % (ref 43–77)
Platelets: 238 10*3/uL (ref 150–400)
RBC: 4.94 MIL/uL (ref 3.87–5.11)
RDW: 13.3 % (ref 11.5–15.5)
WBC: 5.7 10*3/uL (ref 4.0–10.5)

## 2014-11-27 LAB — ABO/RH: ABO/RH(D): B POS

## 2014-11-27 NOTE — Patient Instructions (Signed)
Jillian Dennis  11/27/2014   Your procedure is scheduled on: Tuesday 12/02/2014  Report to [facility] Main  Entrance and follow signs to               [place] [facility] [place].  Call this number if you have problems the morning of surgery [phone]   Remember:  Do not eat food or drink liquids :After Midnight.     Take these medicines the morning of surgery with A SIP OF WATER: none                               You may not have any metal on your body including hair pins and              piercings  Do not wear jewelry, make-up, lotions, powders or perfumes.             Do not wear nail polish.  Do not shave  48 hours prior to surgery.              Men may shave face and neck.   Do not bring valuables to the hospital. [place].  Contacts, dentures or bridgework may not be worn into surgery.  Leave suitcase in the car. After surgery it may be brought to your room.     Patients discharged the day of surgery will not be allowed to drive home.  Name and phone number of your driver:  Special Instructions: N/A              Please read over the following fact sheets you were given: _____________________________________________________________________             [facility] - Preparing for Surgery Before surgery, you can play an important role.  Because skin is not sterile, your skin needs to be as free of germs as possible.  You can reduce the number of germs on your skin by washing with CHG (chlorahexidine gluconate) soap before surgery.  CHG is an antiseptic cleaner which kills germs and bonds with the skin to continue killing germs even after washing. Please DO NOT use if you have an allergy to CHG or antibacterial soaps.  If your skin becomes reddened/irritated stop using the CHG and inform your nurse when you arrive at Short Stay. Do not shave (including legs and underarms) for at least 48 hours prior  to the first CHG shower.  You may shave your face/neck. Please follow these instructions carefully:  1.  Shower with CHG Soap the night before surgery and the  morning of Surgery.  2.  If you choose to wash your hair, wash your hair first as usual with your  normal  shampoo.  3.  After you shampoo, rinse your hair and body thoroughly to remove the  shampoo.                           4.  Use CHG as you would any other liquid soap.  You can apply chg directly  to the skin and wash                       Gently  with a scrungie or clean washcloth.  5.  Apply the CHG Soap to your body ONLY FROM THE NECK DOWN.   Do not use on face/ open                           Wound or open sores. Avoid contact with eyes, ears mouth and genitals (private parts).                       Wash face,  Genitals (private parts) with your normal soap.             6.  Wash thoroughly, paying special attention to the area where your surgery  will be performed.  7.  Thoroughly rinse your body with warm water from the neck down.  8.  DO NOT shower/wash with your normal soap after using and rinsing off  the CHG Soap.                9.  Pat yourself dry with a clean towel.            10.  Wear clean pajamas.            11.  Place clean sheets on your bed the night of your first shower and do not  sleep with pets. Day of Surgery : Do not apply any lotions/deodorants the morning of surgery.  Please wear clean clothes to the hospital/surgery center.  FAILURE TO FOLLOW THESE INSTRUCTIONS MAY RESULT IN THE CANCELLATION OF YOUR SURGERY PATIENT SIGNATURE_________________________________  NURSE SIGNATURE__________________________________  ________________________________________________________________________   Adam Phenix  An incentive spirometer is a tool that can help keep your lungs clear and active. This tool measures how well you are filling your lungs with each breath. Taking long deep breaths may help reverse or  decrease the chance of developing breathing (pulmonary) problems (especially infection) following:  A long period of time when you are unable to move or be active. BEFORE THE PROCEDURE   If the spirometer includes an indicator to show your best effort, your nurse or respiratory therapist will set it to a desired goal.  If possible, sit up straight or lean slightly forward. Try not to slouch.  Hold the incentive spirometer in an upright position. INSTRUCTIONS FOR USE   Sit on the edge of your bed if possible, or sit up as far as you can in bed or on a chair.  Hold the incentive spirometer in an upright position.  Breathe out normally.  Place the mouthpiece in your mouth and seal your lips tightly around it.  Breathe in slowly and as deeply as possible, raising the piston or the ball toward the top of the column.  Hold your breath for 3-5 seconds or for as long as possible. Allow the piston or ball to fall to the bottom of the column.  Remove the mouthpiece from your mouth and breathe out normally.  Rest for a few seconds and repeat Steps 1 through 7 at least 10 times every 1-2 hours when you are awake. Take your time and take a few normal breaths between deep breaths.  The spirometer may include an indicator to show your best effort. Use the indicator as a goal to work toward during each repetition.  After each set of 10 deep breaths, practice coughing to be sure your lungs are clear. If you have an incision (the cut made at the time of surgery), support  your incision when coughing by placing a pillow or rolled up towels firmly against it. Once you are able to get out of bed, walk around indoors and cough well. You may stop using the incentive spirometer when instructed by your caregiver.  RISKS AND COMPLICATIONS  Take your time so you do not get dizzy or light-headed.  If you are in pain, you may need to take or ask for pain medication before doing incentive spirometry. It is  harder to take a deep breath if you are having pain. AFTER USE  Rest and breathe slowly and easily.  It can be helpful to keep track of a log of your progress. Your caregiver can provide you with a simple table to help with this. If you are using the spirometer at home, follow these instructions: [place] IF:   You are having difficultly using the spirometer.  You have trouble using the spirometer as often as instructed.  Your pain medication is not giving enough relief while using the spirometer.  You develop fever of 100.5 F (38.1 C) or higher. SEEK IMMEDIATE MEDICAL CARE IF:   You cough up bloody sputum that had not been present before.  You develop fever of 102 F (38.9 C) or greater.  You develop worsening pain at or near the incision site. MAKE SURE YOU:   Understand these instructions.  Will watch your condition.  Will get help right away if you are not doing well or get worse. Document Released: 02/27/2007 Document Revised: 01/09/2012 Document Reviewed: 04/30/2007 [employer] Patient Information 2014 [employer], [place].   ________________________________________________________________________  WHAT IS A BLOOD TRANSFUSION? Blood Transfusion Information  A transfusion is the replacement of blood or some of its parts. Blood is made up of multiple cells which provide different functions.  Red blood cells carry oxygen and are used for blood loss replacement.  White blood cells fight against infection.  Platelets control bleeding.  Plasma helps clot blood.  Other blood products are available for specialized needs, such as hemophilia or other clotting disorders. BEFORE THE TRANSFUSION  Who gives blood for transfusions?   Healthy volunteers who are fully evaluated to make sure their blood is safe. This is blood bank blood. Transfusion therapy is the safest it has ever been in the practice of medicine. Before blood is taken from a donor, a complete history  is taken to make sure that person has no history of diseases nor engages in risky social behavior (examples are intravenous drug use or sexual activity with multiple partners). The donor's travel history is screened to minimize risk of transmitting infections, such as malaria. The donated blood is tested for signs of infectious diseases, such as HIV and hepatitis. The blood is then tested to be sure it is compatible with you in order to minimize the chance of a transfusion reaction. If you or a relative donates blood, this is often done in anticipation of surgery and is not appropriate for emergency situations. It takes many days to process the donated blood. RISKS AND COMPLICATIONS Although transfusion therapy is very safe and saves many lives, the main dangers of transfusion include:   Getting an infectious disease.  Developing a transfusion reaction. This is an allergic reaction to something in the blood you were given. Every precaution is taken to prevent this. The decision to have a blood transfusion has been considered carefully by your caregiver before blood is given. Blood is not given unless the benefits outweigh the risks. AFTER THE TRANSFUSION  Right after receiving a blood transfusion, you will usually feel much better and more energetic. This is especially true if your red blood cells have gotten low (anemic). The transfusion raises the level of the red blood cells which carry oxygen, and this usually causes an energy increase.  The nurse administering the transfusion will monitor you carefully for complications. HOME CARE INSTRUCTIONS  No special instructions are needed after a transfusion. You may find your energy is better. Speak with your caregiver about any limitations on activity for underlying diseases you may have. SEEK MEDICAL CARE IF:   Your condition is not improving after your transfusion.  You develop redness or irritation at the intravenous (IV) site. SEEK IMMEDIATE  MEDICAL CARE IF:  Any of the following symptoms occur over the next 12 hours:  Shaking chills.  You have a temperature by mouth above 102 F (38.9 C), not controlled by medicine.  Chest, back, or muscle pain.  People around you feel you are not acting correctly or are confused.  Shortness of breath or difficulty breathing.  Dizziness and fainting.  You get a rash or develop hives.  You have a decrease in urine output.  Your urine turns a dark color or changes to pink, red, or brown. Any of the following symptoms occur over the next 10 days:  You have a temperature by mouth above 102 F (38.9 C), not controlled by medicine.  Shortness of breath.  Weakness after normal activity.  The white part of the eye turns yellow (jaundice).  You have a decrease in the amount of urine or are urinating less often.  Your urine turns a dark color or changes to pink, red, or brown. Document Released: 10/14/2000 Document Revised: 01/09/2012 Document Reviewed: 06/02/2008 [facility] Patient Information 2014 [place], [place].  _______________________________________________________________________

## 2014-12-01 ENCOUNTER — Telehealth: Payer: Self-pay | Admitting: Nurse Practitioner

## 2014-12-01 NOTE — Telephone Encounter (Signed)
RN called patient to remind her of surgery on 12/02/14 and to ensure she is on clear liquid diet. Patient states she was unaware of that and did eat breakfast. Patient instructed to stop solids at this time and may drink broth and clear liquids the rest of today, with nothing at all after midnight. She verbalizes understanding. Patient also states she was unaware to refrain from ibuprofen and did take a dose last week, either Tuesday or Wednesday. Per Joylene John, NP the above will not interfere with surgery tomorrow. Patient informed to call clinic with any other questions, she verbalizes understanding.

## 2014-12-02 ENCOUNTER — Ambulatory Visit (HOSPITAL_COMMUNITY): Payer: BLUE CROSS/BLUE SHIELD | Admitting: *Deleted

## 2014-12-02 ENCOUNTER — Encounter (HOSPITAL_COMMUNITY): Payer: Self-pay

## 2014-12-02 ENCOUNTER — Other Ambulatory Visit: Payer: Self-pay | Admitting: Gynecologic Oncology

## 2014-12-02 ENCOUNTER — Ambulatory Visit (HOSPITAL_COMMUNITY)
Admission: RE | Admit: 2014-12-02 | Discharge: 2014-12-02 | Disposition: A | Discharge status: Home / Self Care | Payer: BLUE CROSS/BLUE SHIELD | Source: Ambulatory Visit | Attending: Gynecologic Oncology | Admitting: Gynecologic Oncology

## 2014-12-02 ENCOUNTER — Encounter (HOSPITAL_COMMUNITY)
Admission: RE | Disposition: A | Discharge status: Home / Self Care | Payer: Self-pay | Source: Ambulatory Visit | Attending: Gynecologic Oncology

## 2014-12-02 DIAGNOSIS — N8320 Unspecified ovarian cysts: Secondary | ICD-10-CM | POA: Diagnosis not present

## 2014-12-02 DIAGNOSIS — N736 Female pelvic peritoneal adhesions (postinfective): Secondary | ICD-10-CM | POA: Insufficient documentation

## 2014-12-02 DIAGNOSIS — Z8041 Family history of malignant neoplasm of ovary: Secondary | ICD-10-CM | POA: Insufficient documentation

## 2014-12-02 DIAGNOSIS — E8940 Asymptomatic postprocedural ovarian failure: Secondary | ICD-10-CM

## 2014-12-02 DIAGNOSIS — D3910 Neoplasm of uncertain behavior of unspecified ovary: Secondary | ICD-10-CM

## 2014-12-02 DIAGNOSIS — E785 Hyperlipidemia, unspecified: Secondary | ICD-10-CM | POA: Diagnosis not present

## 2014-12-02 DIAGNOSIS — N838 Other noninflammatory disorders of ovary, fallopian tube and broad ligament: Secondary | ICD-10-CM

## 2014-12-02 HISTORY — PX: ROBOTIC ASSISTED BILATERAL SALPINGO OOPHERECTOMY: SHX6078

## 2014-12-02 LAB — TYPE AND SCREEN
ABO/RH(D): B POS
ANTIBODY SCREEN: NEGATIVE

## 2014-12-02 SURGERY — ROBOTIC ASSISTED BILATERAL SALPINGO OOPHORECTOMY
Anesthesia: General | Laterality: Bilateral

## 2014-12-02 MED ORDER — ROCURONIUM BROMIDE 100 MG/10ML IV SOLN
INTRAVENOUS | Status: DC | PRN
  Administered 2014-12-02: 10 mg via INTRAVENOUS
  Administered 2014-12-02: 50 mg via INTRAVENOUS

## 2014-12-02 MED ORDER — PROPOFOL 10 MG/ML IV BOLUS
INTRAVENOUS | Status: AC
  Filled 2014-12-02: qty 20

## 2014-12-02 MED ORDER — KETOROLAC TROMETHAMINE 30 MG/ML IJ SOLN
INTRAMUSCULAR | Status: AC
  Filled 2014-12-02: qty 1

## 2014-12-02 MED ORDER — FENTANYL CITRATE 0.05 MG/ML IJ SOLN
INTRAMUSCULAR | Status: AC
  Filled 2014-12-02: qty 5

## 2014-12-02 MED ORDER — ROCURONIUM BROMIDE 100 MG/10ML IV SOLN
INTRAVENOUS | Status: AC
  Filled 2014-12-02: qty 1

## 2014-12-02 MED ORDER — HYDROMORPHONE HCL 1 MG/ML IJ SOLN
INTRAMUSCULAR | Status: DC | PRN
  Administered 2014-12-02: 1 mg via INTRAVENOUS

## 2014-12-02 MED ORDER — MEPERIDINE HCL 50 MG/ML IJ SOLN: 6.2500 mg | INTRAMUSCULAR | Status: DC | PRN

## 2014-12-02 MED ORDER — METOCLOPRAMIDE HCL 5 MG/ML IJ SOLN
INTRAMUSCULAR | Status: DC | PRN
  Administered 2014-12-02 (×2): 5 mg via INTRAVENOUS

## 2014-12-02 MED ORDER — DEXAMETHASONE SODIUM PHOSPHATE 4 MG/ML IJ SOLN
INTRAMUSCULAR | Status: DC | PRN
Start: 2014-12-02 — End: 2014-12-02
  Administered 2014-12-02: 10 mg via INTRAVENOUS

## 2014-12-02 MED ORDER — ACETAMINOPHEN 325 MG PO TABS: 650.0000 mg | ORAL_TABLET | ORAL | Status: DC | PRN

## 2014-12-02 MED ORDER — PROPOFOL 10 MG/ML IV BOLUS
INTRAVENOUS | Status: DC | PRN
  Administered 2014-12-02: 150 mg via INTRAVENOUS

## 2014-12-02 MED ORDER — FENTANYL CITRATE 0.05 MG/ML IJ SOLN
INTRAMUSCULAR | Status: DC | PRN
  Administered 2014-12-02: 25 ug via INTRAVENOUS
  Administered 2014-12-02: 100 ug via INTRAVENOUS
  Administered 2014-12-02 (×2): 50 ug via INTRAVENOUS
  Administered 2014-12-02: 25 ug via INTRAVENOUS

## 2014-12-02 MED ORDER — OXYCODONE-ACETAMINOPHEN 10-325 MG PO TABS: 1.0000 | ORAL_TABLET | ORAL | Status: DC | PRN

## 2014-12-02 MED ORDER — LACTATED RINGERS IV SOLN: INTRAVENOUS | Status: DC

## 2014-12-02 MED ORDER — NEOSTIGMINE METHYLSULFATE 10 MG/10ML IV SOLN
INTRAVENOUS | Status: AC
  Filled 2014-12-02: qty 1

## 2014-12-02 MED ORDER — HYDROMORPHONE HCL 2 MG/ML IJ SOLN
INTRAMUSCULAR | Status: AC
  Filled 2014-12-02: qty 1

## 2014-12-02 MED ORDER — OXYCODONE HCL 5 MG PO TABS
5.0000 mg | ORAL_TABLET | ORAL | Status: DC | PRN
  Administered 2014-12-02 (×2): 5 mg via ORAL
  Filled 2014-12-02 (×2): qty 1

## 2014-12-02 MED ORDER — METOCLOPRAMIDE HCL 5 MG/ML IJ SOLN
INTRAMUSCULAR | Status: AC
  Filled 2014-12-02: qty 2

## 2014-12-02 MED ORDER — ESTRADIOL 0.1 MG/24HR TD PTWK: 0.1000 mg | MEDICATED_PATCH | TRANSDERMAL | Status: DC

## 2014-12-02 MED ORDER — SODIUM CHLORIDE 0.9 % IV SOLN: 250.0000 mL | INTRAVENOUS | Status: DC | PRN

## 2014-12-02 MED ORDER — ONDANSETRON HCL 4 MG/2ML IJ SOLN
INTRAMUSCULAR | Status: AC
  Filled 2014-12-02: qty 2

## 2014-12-02 MED ORDER — KETOROLAC TROMETHAMINE 30 MG/ML IJ SOLN
INTRAMUSCULAR | Status: DC | PRN
  Administered 2014-12-02: 30 mg via INTRAVENOUS

## 2014-12-02 MED ORDER — LACTATED RINGERS IV SOLN
INTRAVENOUS | Status: DC | PRN
  Administered 2014-12-02 (×3): via INTRAVENOUS

## 2014-12-02 MED ORDER — ACETAMINOPHEN 650 MG RE SUPP
650.0000 mg | RECTAL | Status: DC | PRN
  Filled 2014-12-02: qty 1

## 2014-12-02 MED ORDER — STERILE WATER FOR IRRIGATION IR SOLN
Status: DC | PRN
  Administered 2014-12-02: 3000 mL

## 2014-12-02 MED ORDER — LACTATED RINGERS IR SOLN
Status: DC | PRN
  Administered 2014-12-02: 1000 mL

## 2014-12-02 MED ORDER — ONDANSETRON HCL 4 MG/2ML IJ SOLN
INTRAMUSCULAR | Status: DC | PRN
  Administered 2014-12-02 (×2): 2 mg via INTRAVENOUS

## 2014-12-02 MED ORDER — DEXAMETHASONE SODIUM PHOSPHATE 10 MG/ML IJ SOLN
INTRAMUSCULAR | Status: AC
  Filled 2014-12-02: qty 1

## 2014-12-02 MED ORDER — NEOSTIGMINE METHYLSULFATE 10 MG/10ML IV SOLN
INTRAVENOUS | Status: DC | PRN
  Administered 2014-12-02: 5 mg via INTRAVENOUS

## 2014-12-02 MED ORDER — SODIUM CHLORIDE 0.9 % IJ SOLN: 3.0000 mL | INTRAMUSCULAR | Status: DC | PRN

## 2014-12-02 MED ORDER — PROMETHAZINE HCL 25 MG/ML IJ SOLN: 6.2500 mg | INTRAMUSCULAR | Status: DC | PRN

## 2014-12-02 MED ORDER — GLYCOPYRROLATE 0.2 MG/ML IJ SOLN
INTRAMUSCULAR | Status: DC | PRN
  Administered 2014-12-02: 0.6 mg via INTRAVENOUS

## 2014-12-02 MED ORDER — MIDAZOLAM HCL 2 MG/2ML IJ SOLN
INTRAMUSCULAR | Status: AC
  Filled 2014-12-02: qty 2

## 2014-12-02 MED ORDER — MIDAZOLAM HCL 5 MG/5ML IJ SOLN
INTRAMUSCULAR | Status: DC | PRN
  Administered 2014-12-02 (×2): 1 mg via INTRAVENOUS

## 2014-12-02 MED ORDER — GLYCOPYRROLATE 0.2 MG/ML IJ SOLN
INTRAMUSCULAR | Status: AC
  Filled 2014-12-02: qty 3

## 2014-12-02 MED ORDER — HYDROMORPHONE HCL 1 MG/ML IJ SOLN: 0.2500 mg | INTRAMUSCULAR | Status: DC | PRN

## 2014-12-02 MED ORDER — SODIUM CHLORIDE 0.9 % IJ SOLN: 3.0000 mL | Freq: Two times a day (BID) | INTRAMUSCULAR | Status: DC

## 2014-12-02 SURGICAL SUPPLY — 65 items
APL SKNCLS STERI-STRIP NONHPOA (GAUZE/BANDAGES/DRESSINGS)
BAG SPEC RTRVL LRG 6X4 10 (ENDOMECHANICALS) ×3
BENZOIN TINCTURE PRP APPL 2/3 (GAUZE/BANDAGES/DRESSINGS) IMPLANT
CABLE HIGH FREQUENCY MONO STRZ (ELECTRODE) ×2 IMPLANT
CHLORAPREP W/TINT 26ML (MISCELLANEOUS) ×2 IMPLANT
CORDS BIPOLAR (ELECTRODE) ×2 IMPLANT
COVER MAYO STAND STRL (DRAPES) ×2 IMPLANT
COVER SURGICAL LIGHT HANDLE (MISCELLANEOUS) ×2 IMPLANT
COVER TIP SHEARS 8 DVNC (MISCELLANEOUS) ×1 IMPLANT
COVER TIP SHEARS 8MM DA VINCI (MISCELLANEOUS) ×1
DRAPE SHEET LG 3/4 BI-LAMINATE (DRAPES) ×4 IMPLANT
DRAPE SURG IRRIG POUCH 19X23 (DRAPES) ×2 IMPLANT
DRAPE TABLE BACK 44X90 PK DISP (DRAPES) ×4 IMPLANT
DRAPE UTILITY XL STRL (DRAPES) ×2 IMPLANT
DRAPE WARM FLUID 44X44 (DRAPE) ×2 IMPLANT
DRSG TEGADERM 2-3/8X2-3/4 SM (GAUZE/BANDAGES/DRESSINGS) IMPLANT
DRSG TEGADERM 4X4.75 (GAUZE/BANDAGES/DRESSINGS) IMPLANT
DRSG TEGADERM 6X8 (GAUZE/BANDAGES/DRESSINGS) IMPLANT
ELECT REM PT RETURN 9FT ADLT (ELECTROSURGICAL) ×2
ELECTRODE REM PT RTRN 9FT ADLT (ELECTROSURGICAL) ×1 IMPLANT
GAUZE SPONGE 2X2 8PLY STRL LF (GAUZE/BANDAGES/DRESSINGS) IMPLANT
GLOVE BIO SURGEON STRL SZ 6 (GLOVE) ×6 IMPLANT
GLOVE BIO SURGEON STRL SZ 6.5 (GLOVE) IMPLANT
GLOVE BIO SURGEON STRL SZ7.5 (GLOVE) IMPLANT
GLOVE BIOGEL PI IND STRL 7.0 (GLOVE) IMPLANT
GLOVE BIOGEL PI INDICATOR 7.0 (GLOVE)
GOWN STRL REUS W/ TWL XL LVL3 (GOWN DISPOSABLE) ×2 IMPLANT
GOWN STRL REUS W/TWL XL LVL3 (GOWN DISPOSABLE) ×4
HOLDER FOLEY CATH W/STRAP (MISCELLANEOUS) ×2 IMPLANT
KIT ACCESSORY DA VINCI DISP (KITS) ×1
KIT ACCESSORY DVNC DISP (KITS) ×1 IMPLANT
KIT BASIN OR (CUSTOM PROCEDURE TRAY) ×2 IMPLANT
LIQUID BAND (GAUZE/BANDAGES/DRESSINGS) ×4 IMPLANT
MANIPULATOR UTERINE 4.5 ZUMI (MISCELLANEOUS) IMPLANT
OCCLUDER COLPOPNEUMO (BALLOONS) IMPLANT
PAD POSITIONING PINK XL (MISCELLANEOUS) ×2 IMPLANT
PORT ACCESS TROCAR AIRSEAL 12 (TROCAR) ×1 IMPLANT
PORT ACCESS TROCAR AIRSEAL 5M (TROCAR) ×1
POUCH SPECIMEN RETRIEVAL 10MM (ENDOMECHANICALS) ×6 IMPLANT
SET TRI-LUMEN FLTR TB AIRSEAL (TUBING) ×2 IMPLANT
SET TUBE IRRIG SUCTION NO TIP (IRRIGATION / IRRIGATOR) ×2 IMPLANT
SHEET LAVH (DRAPES) ×2 IMPLANT
SLEEVE SURGEON STRL (DRAPES) ×2 IMPLANT
SOLUTION ANTI FOG 6CC (MISCELLANEOUS) ×2 IMPLANT
SOLUTION ELECTROLUBE (MISCELLANEOUS) ×2 IMPLANT
SPONGE GAUZE 2X2 STER 10/PKG (GAUZE/BANDAGES/DRESSINGS)
SPONGE LAP 18X18 X RAY DECT (DISPOSABLE) IMPLANT
STRIP CLOSURE SKIN 1/2X4 (GAUZE/BANDAGES/DRESSINGS) IMPLANT
SUT VIC AB 0 CT1 27 (SUTURE)
SUT VIC AB 0 CT1 27XBRD ANTBC (SUTURE) IMPLANT
SUT VIC AB 3-0 SH 27 (SUTURE) ×4
SUT VIC AB 3-0 SH 27XBRD (SUTURE) ×2 IMPLANT
SUT VIC AB 4-0 PS2 27 (SUTURE) ×4 IMPLANT
SUT VICRYL 0 UR6 27IN ABS (SUTURE) ×2 IMPLANT
SYR 50ML LL SCALE MARK (SYRINGE) ×2 IMPLANT
SYR BULB IRRIGATION 50ML (SYRINGE) IMPLANT
TOWEL OR 17X26 10 PK STRL BLUE (TOWEL DISPOSABLE) ×4 IMPLANT
TRAP SPECIMEN MUCOUS 40CC (MISCELLANEOUS) ×2 IMPLANT
TRAY FOLEY CATH 14FRSI W/METER (CATHETERS) ×2 IMPLANT
TRAY LAPAROSCOPIC (CUSTOM PROCEDURE TRAY) ×2 IMPLANT
TROCAR 12M 150ML BLUNT (TROCAR) ×2 IMPLANT
TROCAR BLADELESS OPT 5 100 (ENDOMECHANICALS) ×2 IMPLANT
TROCAR XCEL 12X100 BLDLESS (ENDOMECHANICALS) ×2 IMPLANT
TUBING INSUFFLATION 10FT LAP (TUBING) IMPLANT
WATER STERILE IRR 1500ML POUR (IV SOLUTION) IMPLANT

## 2014-12-02 NOTE — Transfer of Care (Signed)
Immediate Anesthesia Transfer of Care Note  Patient: Jillian Dennis  Procedure(s) Performed: Procedure(s): ROBOTIC ASSISTED BILATERAL OOPHORECTOMY/PERITENEAL WASHING/OMENTECTOMY (Bilateral)  Patient Location: PACU  Anesthesia Type:General  Level of Consciousness: Patient easily awoken, sedated, comfortable, cooperative, following commands, responds to stimulation.   Airway & Oxygen Therapy: Patient spontaneously breathing, ventilating well, oxygen via simple oxygen mask.  Post-op Assessment: Report given to PACU RN, vital signs reviewed and stable, moving all extremities.   Post vital signs: Reviewed and stable.  Complications: No apparent anesthesia complications

## 2014-12-02 NOTE — Progress Notes (Signed)
Climara 0.1mg  weekly per Dr. Denman George

## 2014-12-02 NOTE — Discharge Instructions (Signed)
12/02/2014  Return to work: 1 - 2 weeks  Activity: 1. Be up and out of the bed during the day.  Take a nap if needed.  You may walk up steps but be careful and use the hand rail.  Stair climbing will tire you more than you think, you may need to stop part way and rest.   2. No lifting or straining for 6 weeks.  3. No driving for 1 week.  Do Not drive if you are taking narcotic pain medicine.  4. Shower daily.  Use soap and water on your incision and pat dry; don't rub.   5. No sexual activity and nothing in the vagina for 6 weeks.  Diet: 1. Low sodium Heart Healthy Diet is recommended.  2. It is safe to use a laxative if you have difficulty moving your bowels.   Wound Care: 1. Keep clean and dry.  Shower daily.  Reasons to call the Doctor:   Fever - Oral temperature greater than 100.4 degrees Fahrenheit  Foul-smelling vaginal discharge  Difficulty urinating  Nausea and vomiting  Increased pain at the site of the incision that is unrelieved with pain medicine.  Difficulty breathing with or without chest pain  New calf pain especially if only on one side  Sudden, continuing increased vaginal bleeding with or without clots.     Contacts: For questions or concerns you should contact:  Dr. Lahoma Crocker at [phone]  Dr. Everitt Amber at [facility] [phone]  Bilateral Salpingo-Oophorectomy, Care After Refer to this sheet in the next few weeks. These instructions provide you with information on caring for yourself after your procedure. Your health care provider may also give you more specific instructions. Your treatment has been planned according to current medical practices, but problems sometimes occur. Call your health care provider if you have any problems or questions after your procedure. WHAT TO EXPECT AFTER THE PROCEDURE After your procedure, it is typical to have the following:   Abdominal pain that can be controlled with medicine.  Vaginal  spotting.  Constipation.  Menopausal symptoms such as hot flashes, vaginal dryness, and mood swings. HOME CARE INSTRUCTIONS   Get plenty of rest and sleep.  Only take over-the-counter or prescription medicines as directed by your health care provider. Do not take aspirin. It can cause bleeding.  Keep incision areas clean and dry. Remove or change bandages (dressings) only as directed by your health care provider.  Take showers instead of baths for a few weeks as directed by your health care provider.  Limit exercise and activities as directed by your health care provider. Do not lift anything heavier than 5 pounds (2.3 kg) until your health care provider approves.  Do not drive until your health care provider approves.  Follow your health care provider's advice regarding diet. You may be able to resume your usual diet right away.  Drink enough fluids to keep your urine clear or pale yellow.  Do not douche, use tampons, or have sexual intercourse for 6 weeks after the procedure.  Do not drink alcohol until your health care provider says it is okay.  Take your temperature twice a day and write it down.  If you become constipated, you may:  Ask your health care provider about taking a mild laxative.  Add more fruit and bran to your diet.  Drink more fluids.  Follow up with your health care provider as directed. SEEK MEDICAL CARE IF:   You have swelling, redness, or increasing pain  in the incision area.  You see pus coming from the incision area.  You notice a bad smell coming from the wound or dressing.  You have pain, redness, or swelling where the IV access tube was placed.  Your incision is breaking open (the edges are not staying together).  You feel dizzy or feel like fainting.  You develop pain or bleeding when you urinate.  You develop diarrhea.  You develop nausea and vomiting.  You develop abnormal vaginal discharge.  You develop a rash.  You have  pain that is not controlled with medicine. SEEK IMMEDIATE MEDICAL CARE IF:   You develop a fever.  You develop abdominal pain.  You have chest pain.  You develop shortness of breath.  You pass out.  You develop pain, swelling, or redness in your leg.  You develop heavy vaginal bleeding with or without blood clots. Document Released: 10/17/2005 Document Revised: 06/19/2013 Document Reviewed: 04/10/2013 [facility] Patient Information 2015 [place], [place]. This information is not intended to replace advice given to you by your health care provider. Make sure you discuss any questions you have with your health care provider.    General Anesthesia, Care After Refer to this sheet in the next few weeks. These instructions provide you with information on caring for yourself after your procedure. Your health care provider may also give you more specific instructions. Your treatment has been planned according to current medical practices, but problems sometimes occur. Call your health care provider if you have any problems or questions after your procedure. WHAT TO EXPECT AFTER THE PROCEDURE After the procedure, it is typical to experience:  Sleepiness.  Nausea and vomiting. HOME CARE INSTRUCTIONS  For the first 24 hours after general anesthesia:  Have a responsible person with you.  Do not drive a car. If you are alone, do not take public transportation.  Do not drink alcohol.  Do not take medicine that has not been prescribed by your health care provider.  Do not sign important papers or make important decisions.  You may resume a normal diet and activities as directed by your health care provider.  Change bandages (dressings) as directed.  If you have questions or problems that seem related to general anesthesia, call the hospital and ask for the anesthetist or anesthesiologist on call. SEEK MEDICAL CARE IF:  You have nausea and vomiting that continue the day after  anesthesia.  You develop a rash. SEEK IMMEDIATE MEDICAL CARE IF:   You have difficulty breathing.  You have chest pain.  You have any allergic problems. Document Released: 01/23/2001 Document Revised: 10/22/2013 Document Reviewed: 05/02/2013 [facility] Patient Information 2015 [place], [place]. This information is not intended to replace advice given to you by your health care provider. Make sure you discuss any questions you have with your health care provider.

## 2014-12-02 NOTE — Anesthesia Procedure Notes (Signed)
Procedure Name: Intubation Date/Time: 12/02/2014 7:17 AM Performed by: Everitt Amber Pre-anesthesia Checklist: Patient identified, Emergency Drugs available, Suction available and Patient being monitored Patient Re-evaluated:Patient Re-evaluated prior to inductionOxygen Delivery Method: Circle system utilized Preoxygenation: Pre-oxygenation with 100% oxygen Intubation Type: IV induction Ventilation: Mask ventilation without difficulty Laryngoscope Size: Mac and 3 Grade View: Grade III Tube type: Oral Tube size: 7.0 mm Number of attempts: 1 Placement Confirmation: positive ETCO2,  CO2 detector and breath sounds checked- equal and bilateral Secured at: 20 cm Tube secured with: Tape Dental Injury: Teeth and Oropharynx as per pre-operative assessment

## 2014-12-02 NOTE — Anesthesia Postprocedure Evaluation (Signed)
  Anesthesia Post-op Note  Patient: Jillian Dennis  Procedure(s) Performed: Procedure(s) (LRB): ROBOTIC ASSISTED BILATERAL OOPHORECTOMY/PERITENEAL WASHING/OMENTECTOMY (Bilateral)  Patient Location: PACU  Anesthesia Type: General  Level of Consciousness: awake and alert   Airway and Oxygen Therapy: Patient Spontanous Breathing  Post-op Pain: mild  Post-op Assessment: Post-op Vital signs reviewed, Patient's Cardiovascular Status Stable, Respiratory Function Stable, Patent Airway and No signs of Nausea or vomiting  Last Vitals:  Filed Vitals:   12/02/14 1400  BP: 122/67  Pulse: 58  Temp: 36.8 C  Resp: 16    Post-op Vital Signs: stable   Complications: No apparent anesthesia complications

## 2014-12-02 NOTE — Op Note (Signed)
Surgeon: [name]   Assistants: Dr Lahoma Crocker  (an MD assistant was necessary for tissue manipulation, management of robotic instrumentation, retraction and positioning due to the complexity of the case and hospital policies).   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 Normal appearing omentum, upper abdomen, small and large intestine. Left ovarian cyst (3cm), normal right ovary. Benign ovaries on frozen section.  Estimated Blood Loss:  less than 50 mL      Total IV Fluids: 2000 ml         Specimens: washings, left and right tube and ovary, omentum         Complications:  None; patient tolerated the procedure well.         Disposition: PACU - hemodynamically stable.  Procedure Details  The patient was seen in the Holding Room. The risks, benefits, complications, treatment options, and expected outcomes were discussed with the patient.  The patient concurred with the proposed plan, giving informed consent.  The site of surgery properly noted/marked. The patient was identified as Jillian Dennis and the procedure verified as a Robotic-assisted bilateral salpingo oophorectomy and omentectomy. A Time Out was held and the above information confirmed.  After induction of anesthesia, the patient was draped and prepped in the usual sterile manner. Pt was placed in supine position after anesthesia and draped and prepped in the usual sterile manner. The abdominal drape was placed after the CholoraPrep had been allowed to dry for 3 minutes.  Her arms were tucked to her side with all appropriate precautions.  The chest was secured to the table.  The patient was placed in the  semi-lithotomy position in [place].  The perineum was prepped with Betadine.  Foley catheter was placed.  OG tube placement was confirmed and to suction.    Procedure:  The patient was brought to the operating room where general anesthesia was administered with no complications.  The patient was placed in the dorsal lithotomy position in padded Allen stirrups.  The arms were tucked at the sides with gel pads protecting the elbows and foam protecting the hands. The patient was then prepped.  A Foley was placed to gravity.  The patient was then draped in the normal manner.  Next, a 5 mm skin incision was made 1 cm below the subcostal margin in the midclavicular line.  The 5 mm Optiview port and scope was used for direct entry.  Opening pressure was under 10 mm CO2.  The abdomen was insufflated and the findings were noted as above.   At this point and all points during the procedure, the patient's intra-abdominal pressure did not exceed 15 mmHg. Next, a 10 mm skin incision was made in the umbilicus and a right and left port was placed about 10 cm lateral to the robot port on the right and left side.   All ports were placed under direct visualization.  The patient was placed in steep Trendelenburg.  Bowel was away into the upper abdomen.  The robot was docked in the normal manner.  Adhesiolysis between the sigmoid colon and left and right ovaries using sharp and monopolar electrical energy with care to avoid discharging energy on the sigmoid bowel wall. The dissection took approximately 30 minutes, after  which time the ovaries were exposed and skeletonized. The round ligament on the right side was incised and the retroperitoneum was entered and the pararectal space was developed.  The ureter was noted to be on the medial leaf of the broad ligament.  The peritoneum above the ureter was incised and stretched and the infundibulopelvic ligament was skeletonized, cauterized and cut.  The utero-ovarian  remnant/attachment to the vaginal cuff was transected with monopolar energy  A similar procedure was performed on the left.  The left and right tubes and ovaries were placed in endocatch bags and retrieved separatey from the left upper quadrant port. Frozen revealed no LMP or invasive malignancy. A deserosalization of the sigmoid colon at the left pelvic brim was oversewn with 3-0 vicryl interrupted. The omentum was delivered into the mid abdomen and elevated. Windows were created in the omentum to separate it from the transverse colon. Bipolar energy was used to seal the vascular pedicles and these were then transected with monopolar robotic scissors. An area of mid transverse colon (<28mm) approximated a site of monopolar application to the omentum and this was oversewn with interrupted 3-0 vicryl interrupted sutures to reinforce the intact bowel wall. Pedicles were inspected and excellent hemostasis was achieved.    Irrigation was used and excellent hemostasis was achieved.  At this point in the procedure was completed.  Robotic instruments were removed under direct visulaization.  The robot was undocked. The 10 mm ports were closed with Vicryl on a UR-5 needle and the fascia was closed with 0 Vicryl on a UR-5 needle.  The skin was closed with 4-0 Vicryl in a subcuticular manner.  Steri-Strips were applied and bandages were also applied.  Sponge, lap and needle counts correct x 2.  The patient was taken to the recovery room in stable condition. All instrument and needle counts were correct x  3.   The patient was transferred to the recovery room in stable condition.   [name], MD

## 2014-12-02 NOTE — H&P (View-Only) (Signed)
Consult Note: Gyn-Onc   Jillian Dennis 49 y.o. female  Chief Complaint  Patient presents with  . 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 Family history of ovarian cancer.  Plan: Robotic assisted bilateral salpingo-oophorectomy scheduled for 12/02/14. Frozen section of ovaries at time of surgery, and staging if occult malignancy is identified. Postoperatively she is interested in transdermal estrogen replacement. I discussed risks and benefits of HRT post-oophorectomy in a premenopausal woman. I am recommending HRT until age of natural menopause (approximately age 40).   HPI: 49 year old white married female gravida 2 seen in consultation request of Dr. 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 The patient underwent a vaginal hysterectomy and bilateral salpingectomy on 10/13/2014 for treatment of menorrhagia. The patient's postoperative course was uncomplicated. Final pathology showed a 3 mm serous borderline tumor in the fallopian tube. Both fallopian tubes were submitted together and therefore the laterality of the tumor is not known. Postoperatively, the patient had some dyspnea and underwent a CT scan to rule out pulmonary embolus. The only significant finding were 2 small pulmonary nodules in the right middle lobe measuring 5 and 4 mm. Pulmonology evaluation in December revealed no underlying pulmonary disease. Her CT showed no suspicious ovarian masses or signs of distant mets.  The patient has a family history of a paternal aunt with ovarian cancer.  Review of Systems:10 point review of systems is negative except as noted in interval history.   Vitals: Blood pressure 145/78, pulse 64, temperature 98.3 F (36.8 C), temperature source Oral, resp. rate 16, height 5\' 4"  (1.626 m), weight 146 lb 12.8 oz (66.588  kg).  Physical Exam: General : The patient is a healthy woman in no acute distress.  HEENT: no masses, normal thyroid Pulm: CTAB CVS: RRR Abdo: soft, nontender, no incisions, no masses, no fluid wave. Genitourinary/pelvic: deferred due to postop state Lymph nodes: no cervical or inguinal adenopathy Musculoskeletal: no deformities or edema.  No Known Allergies  Past Medical History  Diagnosis Date  . SVD (spontaneous vaginal delivery)     x 2  . Hyperlipidemia     diet and exercise controlled - no med    Past Surgical History  Procedure Laterality Date  . Tonsillectomy    . Wisdom tooth extraction    . Vaginal hysterectomy Bilateral 10/13/2014    Procedure: TOTAL VAGINAL HYSTERECTOMY BILATERAL SALPINGECTOMY WITH POSTERIOR REPAIR, CYSTOSCOPY;  Surgeon: Delice Lesch, MD;  Location: [facility];  Service: Gynecology;  Laterality: Bilateral;    Current Outpatient Prescriptions  Medication Sig Dispense Refill  . Calcium Carbonate-Vit D-Min (CALCIUM 1200) 1200-1000 MG-UNIT CHEW Chew 1 tablet by mouth daily.    Marland Kitchen FeFum-FePoly-FA-B Cmp-C-Biot (INTEGRA PLUS) CAPS Take 1 capsule by mouth daily. 60 capsule 1  . ibuprofen (ADVIL,MOTRIN) 600 MG tablet 1 po  pc every 6 hours x 5 days then prn-pain 30 tablet 1  . Multiple Vitamins-Minerals (MULTIVITAMIN WITH MINERALS) tablet Take 1 tablet by mouth daily.    Marland Kitchen oxyCODONE-acetaminophen (PERCOCET/ROXICET) 5-325 MG per tablet Take 1-2 tablets by mouth every 6 (six) hours as needed for severe pain (moderate to severe pain (when tolerating fluids)). (Patient not taking: Reported on 11/07/2014) 30 tablet 0   No current facility-administered medications for this visit.    History   Social History  . Marital Status: Married    Spouse Name: N/A  Number of Children: N/A  . Years of Education: N/A   Occupational History  . Not on file.   Social History Main Topics  . Smoking status: Never Smoker   . Smokeless tobacco: Never Used  . Alcohol  Use: Yes     Comment: rare  . Drug Use: No  . Sexual Activity: Yes    Birth Control/ Protection: Condom   Other Topics Concern  . Not on file   Social History Narrative    Family History  Problem Relation Age of Onset  . Heart disease Father   . Rheum arthritis Mother   . Lung cancer Maternal Grandfather     smoked  . Ovarian cancer Paternal Aunt   . Lung cancer Maternal Uncle     smoked      [name], MD 11/07/2014, 11:24 AM

## 2014-12-02 NOTE — Progress Notes (Addendum)
Patient OOB to recliner chair s/p bilateral oophorectomy. Tolerated well.  1300   Called Dr Denman George as patient states she is to have hormones to go home. To call Joylene John NP at her office. Joylene John NP to call into patient's drug store.

## 2014-12-02 NOTE — Interval H&P Note (Signed)
History and Physical Interval Note:  12/02/2014 7:25 AM  Jillian Dennis  has presented today for surgery, with the diagnosis of borderline tumor  The various methods of treatment have been discussed with the patient and family. After consideration of risks, benefits and other options for treatment, the patient has consented to  Procedure(s): ROBOTIC ASSISTED BILATERAL OOPHORECTOMY/PERITENEAL WASHING/POSSIBLE STAGING (Bilateral) as a surgical intervention .  The patient's history has been reviewed, patient examined, no change in status, stable for surgery.  I have reviewed the patient's chart and labs.  Questions were answered to the patient's satisfaction.     [name]

## 2014-12-02 NOTE — Anesthesia Preprocedure Evaluation (Signed)
Anesthesia Evaluation  Patient identified by MRN, date of birth, ID band Patient awake    Reviewed: Allergy & Precautions, H&P , NPO status , Patient's Chart, lab work & pertinent test results, reviewed documented beta blocker date and time   History of Anesthesia Complications Negative for: history of anesthetic complications  Airway Mallampati: II  TM Distance: >3 FB Neck ROM: full    Dental no notable dental hx.    Pulmonary neg pulmonary ROS,  breath sounds clear to auscultation  Pulmonary exam normal       Cardiovascular Exercise Tolerance: Good negative cardio ROS  Rhythm:regular Rate:Normal     Neuro/Psych negative neurological ROS  negative psych ROS   GI/Hepatic negative GI ROS, Neg liver ROS,   Endo/Other  negative endocrine ROS  Renal/GU negative Renal ROS  negative genitourinary   Musculoskeletal negative musculoskeletal ROS (+)   Abdominal   Peds negative pediatric ROS (+)  Hematology negative hematology ROS (+)   Anesthesia Other Findings   Reproductive/Obstetrics negative OB ROS                             Anesthesia Physical  Anesthesia Plan  ASA: I  Anesthesia Plan: General ETT   Post-op Pain Management:    Induction:   Airway Management Planned:   Additional Equipment:   Intra-op Plan:   Post-operative Plan: Extubation in OR  Informed Consent: I have reviewed the patients History and Physical, chart, labs and discussed the procedure including the risks, benefits and alternatives for the proposed anesthesia with the patient or authorized representative who has indicated his/her understanding and acceptance.   Dental Advisory Given  Plan Discussed with: CRNA and Surgeon  Anesthesia Plan Comments:         Anesthesia Quick Evaluation

## 2014-12-03 ENCOUNTER — Encounter (HOSPITAL_COMMUNITY): Payer: Self-pay | Admitting: Gynecologic Oncology

## 2014-12-19 ENCOUNTER — Encounter: Payer: Self-pay | Admitting: Gynecologic Oncology

## 2014-12-19 ENCOUNTER — Ambulatory Visit: Payer: BLUE CROSS/BLUE SHIELD | Attending: Gynecologic Oncology | Admitting: Gynecologic Oncology

## 2014-12-19 VITALS — BP 130/74 | HR 69 | Temp 98.0°F | Resp 18 | Ht 64.0 in | Wt 149.6 lb

## 2014-12-19 DIAGNOSIS — D3910 Neoplasm of uncertain behavior of unspecified ovary: Secondary | ICD-10-CM | POA: Insufficient documentation

## 2014-12-19 DIAGNOSIS — E8941 Symptomatic postprocedural ovarian failure: Secondary | ICD-10-CM

## 2014-12-19 NOTE — Patient Instructions (Signed)
Plan to follow up with Dr. Mancel Bale in six months and Dr. Denman George in one year or sooner if needed.  Please call closer to the date to schedule your appointment.

## 2014-12-19 NOTE — Progress Notes (Signed)
POSTOPERATIVE FOLLOWUP VISIT  HPI:  Jillian Dennis is a 49 y.o. year old  initially seen in consultation on 10/22/14 for serous LMP in a fallopian tube discovered incidentally at the time of a vaginal hysterectomy.  She then underwent a robotic BSO and omentectomy on 12/02/61 without complications.  Her postoperative course was uncomplicated.  Her final pathology revealed no residual LMP or invasive malignancy in the residual ovary/fallopian tube specimens.  She is seen today for a postoperative check and to discuss her pathology results and ongoing plan.  Since discharge from the hospital, she is feeling well.  She has improving appetite, normal bowel and bladder function, and pain controlled with minimal PO medication. She has no other complaints today. She has some mild breast tenderness since starting the HRT transdermal meds.    Review of systems: Constitutional:  She has no weight gain or weight loss. She has no fever or chills. Eyes: No blurred vision Ears, Nose, Mouth, Throat: No dizziness, headaches or changes in hearing. No mouth sores. Cardiovascular: No chest pain, palpitations or edema. Respiratory:  No shortness of breath, wheezing or cough Gastrointestinal: She has normal bowel movements without diarrhea or constipation. She denies any nausea or vomiting. She denies blood in her stool or heart burn. Genitourinary:  She denies pelvic pain, pelvic pressure or changes in her urinary function. She has no hematuria, dysuria, or incontinence. She has no irregular vaginal bleeding or vaginal discharge Musculoskeletal: Denies muscle weakness or joint pains.  Skin:  She has no skin changes, rashes or itching Neurological:  Denies dizziness or headaches. No neuropathy, no numbness or tingling. Psychiatric:  She denies depression or anxiety. Hematologic/Lymphatic:   No easy bruising or bleeding   Physical Exam: Blood pressure 130/74, pulse 69, temperature 98 F (36.7 C), temperature source  Oral, resp. rate 18, height 5\' 4"  (1.626 m), weight 149 lb 9.6 oz (67.858 kg), SpO2 100 %. General: Well dressed, well nourished in no apparent distress.   HEENT:  Normocephalic and atraumatic, no lesions.  Extraocular muscles intact. Sclerae anicteric. Pupils equal, round, reactive. No mouth sores or ulcers. Thyroid is normal size, not nodular, midline. Skin:  No lesions or rashes. Breasts:  Soft, symmetric.  No skin or nipple changes.  No palpable LN or masses. Lungs:  Clear to auscultation bilaterally.  No wheezes. Cardiovascular:  Regular rate and rhythm.  No murmurs or rubs. Abdomen:  Soft, nontender, nondistended.  No palpable masses.  No hepatosplenomegaly.  No ascites. Normal bowel sounds.  No hernias.  Incisions are well healed Genitourinary: Perineal incision from prior surgery with some mild wound separation. Extremities: No cyanosis, clubbing or edema.  No calf tenderness or erythema. No palpable cords. Psychiatric: Mood and affect are appropriate. Neurological: Awake, alert and oriented x 3. Sensation is intact, no neuropathy.  Musculoskeletal: No pain, normal strength and range of motion.  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   S/p BSO and omentectomy on 12/02/14.   Plan: 1) Pathology reports reviewed today 2) Treatment counseling - patient with low malignant potential tumor of fallopian tube, stage IA. I discussed the very small but potential possibility for recurrence of this lesion and the potential delayed presentation (many years out from original diagnosis) that these recurrences can take place. Therefore I'm recommending surveillance exams with pelvic examination, CA-125, and symptom assessment every 6 months for 10 years. She can alternate these appointments between myself and Dr. Mancel Bale. I discussed that she should continue  hormone replacement therapy until approximate age of natural menopause which should be around age 17 at which time she should  consider it the use of off it to avoid increase risks for breast cancer that are seen associated with long-term use beyond age of natural menopause. She was given the opportunity to ask questions, which were answered to her satisfaction, and she is agreement with the above mentioned plan of care.  3)  Return to clinic 6 months  [name], MD

## 2014-12-23 ENCOUNTER — Encounter: Payer: Self-pay | Admitting: Gynecologic Oncology

## 2015-03-19 ENCOUNTER — Other Ambulatory Visit: Payer: Self-pay | Admitting: Internal Medicine

## 2015-03-19 DIAGNOSIS — R911 Solitary pulmonary nodule: Secondary | ICD-10-CM

## 2015-04-16 ENCOUNTER — Inpatient Hospital Stay: Admission: RE | Admit: 2015-04-16 | Payer: BLUE CROSS/BLUE SHIELD | Source: Ambulatory Visit

## 2015-04-21 ENCOUNTER — Telehealth: Payer: Self-pay | Admitting: Internal Medicine

## 2015-04-21 ENCOUNTER — Ambulatory Visit (INDEPENDENT_AMBULATORY_CARE_PROVIDER_SITE_OTHER)
Admission: RE | Admit: 2015-04-21 | Discharge: 2015-04-21 | Disposition: A | Discharge status: Home / Self Care | Payer: BLUE CROSS/BLUE SHIELD | Source: Ambulatory Visit | Attending: Internal Medicine | Admitting: Internal Medicine

## 2015-04-21 DIAGNOSIS — R911 Solitary pulmonary nodule: Secondary | ICD-10-CM

## 2015-04-21 NOTE — Progress Notes (Signed)
Quick Note:  LMTCB ______ 

## 2015-04-21 NOTE — Telephone Encounter (Signed)
Result Note     Call patient : Study is unremarkable, no change so I don't rec any further studies or f/u unless new resp symptoms - send copy to prima  --  I spoke with patient about results and she verbalized understanding and had no questions

## 2015-04-21 NOTE — Telephone Encounter (Signed)
Pt states returned - [phone]

## 2015-06-16 ENCOUNTER — Other Ambulatory Visit: Payer: Self-pay

## 2015-06-16 ENCOUNTER — Other Ambulatory Visit: Payer: Self-pay | Admitting: Obstetrics and Gynecology

## 2015-06-16 DIAGNOSIS — N6489 Other specified disorders of breast: Secondary | ICD-10-CM

## 2015-07-02 IMAGING — CT CT ABD-PELV W/ CM
1 of 4 series · 11 of 30 positions shown, 17 images · IV contrast (OMNIPAQUE)
Comparison: None.

CLINICAL DATA: Hypoxia. Fever, status posthysterectomy 2 days
prior.

EXAM:
CT ANGIOGRAPHY CHEST
CT ABDOMEN AND PELVIS WITH CONTRAST
TECHNIQUE: Multidetector CT imaging of the chest was performed using the
standard protocol during bolus administration of intravenous
contrast. Multiplanar CT image reconstructions and MIPs were
obtained to evaluate the vascular anatomy. Multidetector CT imaging
of the abdomen and pelvis was performed using the standard protocol
during bolus administration of intravenous contrast.
CONTRAST:  100mL OMNIPAQUE IOHEXOL 350 MG/ML SOLN

[Series 5: routine abdomen/pelvis with · axial · 0.83mm/px · z∈[-519,-84]mm · 11 of 107 slices shown, 17 images]
[im 10/107  mediastinal]
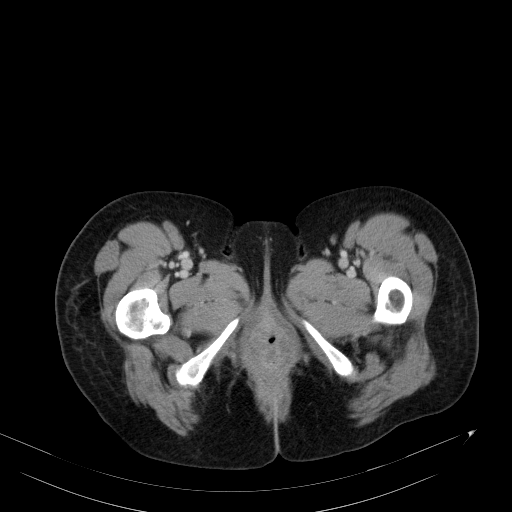
[im 10/107  bone]
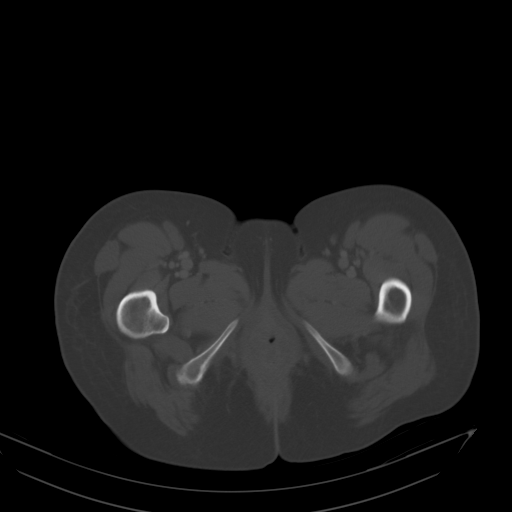
[im 20/107  mediastinal]
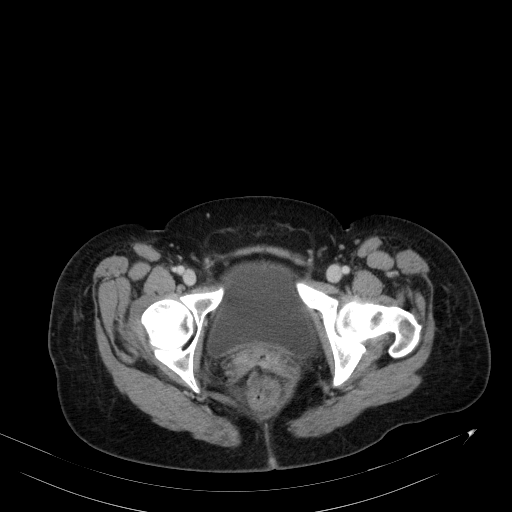
[im 29/107  mediastinal]
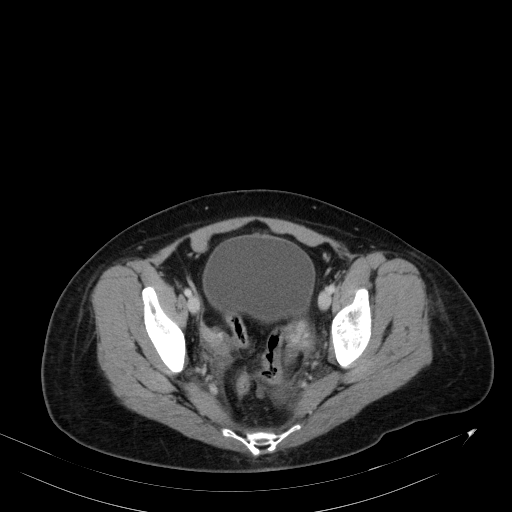
[im 39/107  mediastinal]
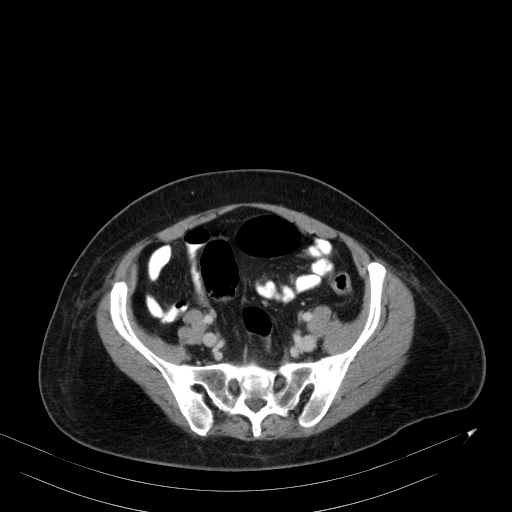
[im 49/107  mediastinal]
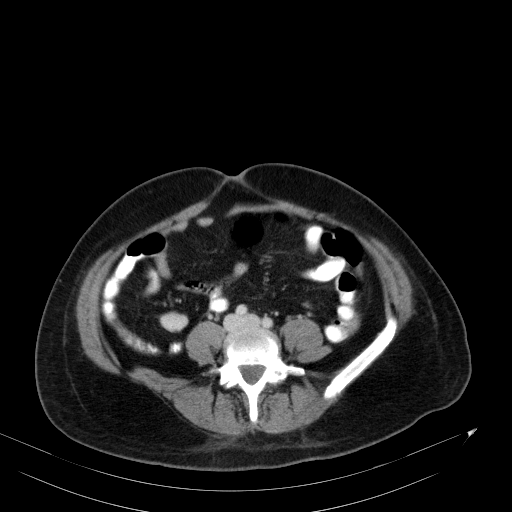
[im 58/107  mediastinal]
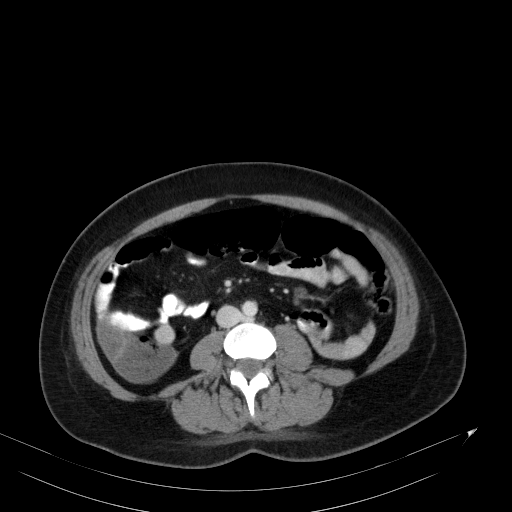
[im 63/107  mediastinal]
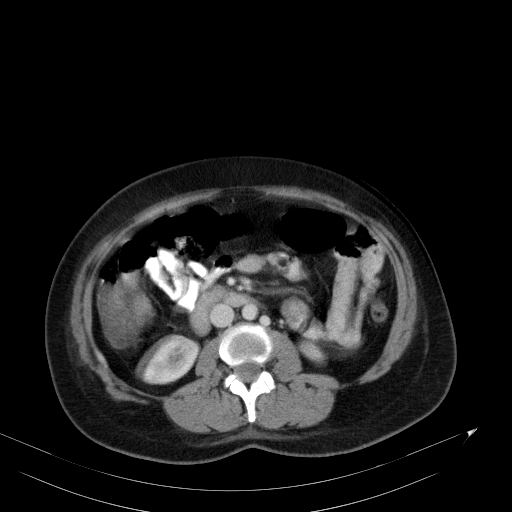
[im 68/107  mediastinal]
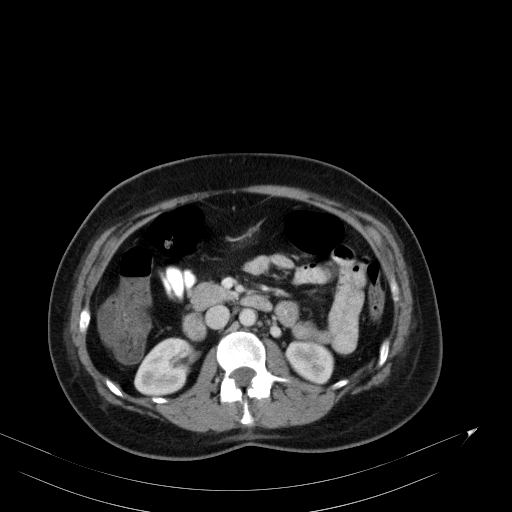
[im 68/107  lung]
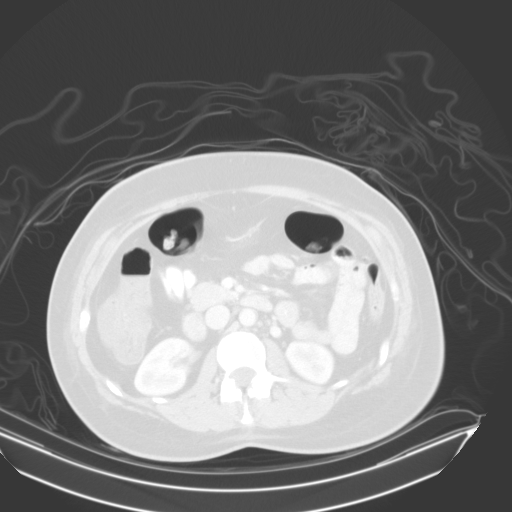
[im 78/107  mediastinal]
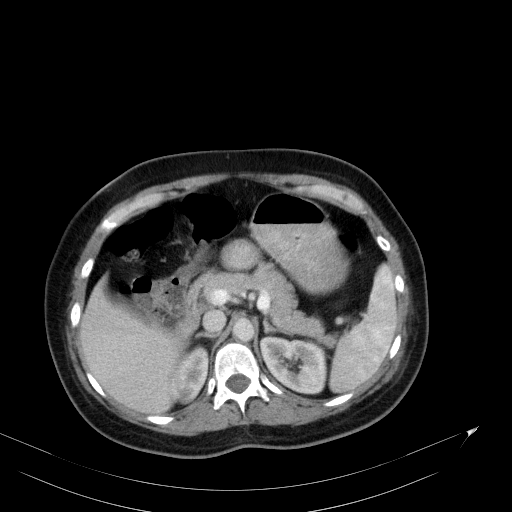
[im 78/107  lung]
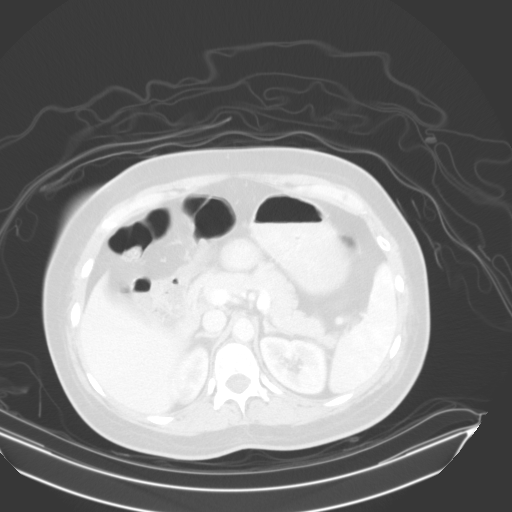
[im 78/107  bone]
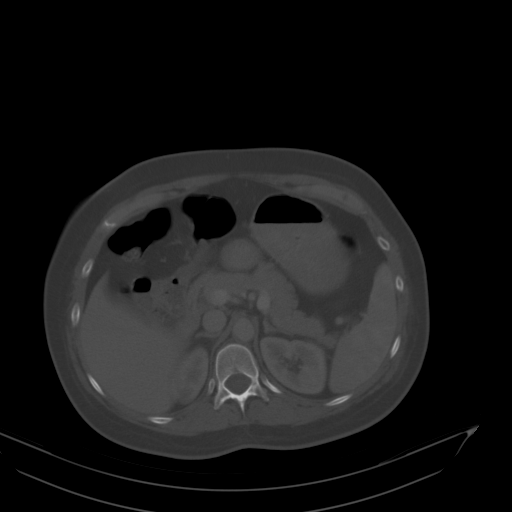
[im 87/107  mediastinal]
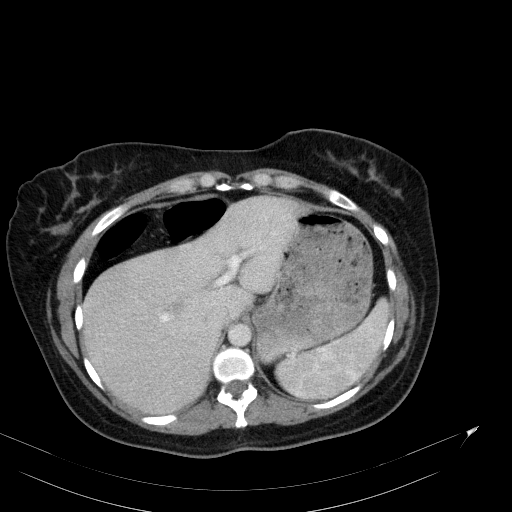
[im 87/107  lung]
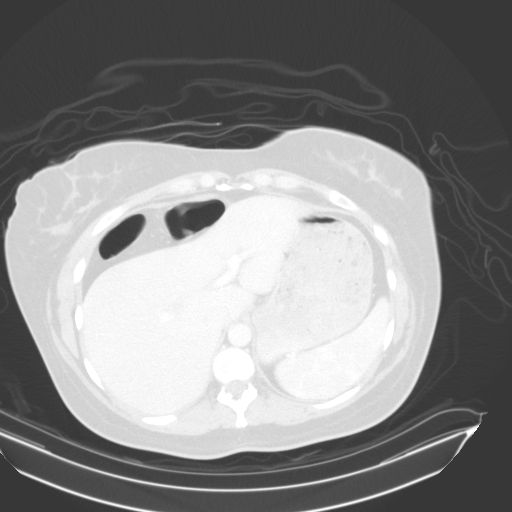
[im 97/107  mediastinal]
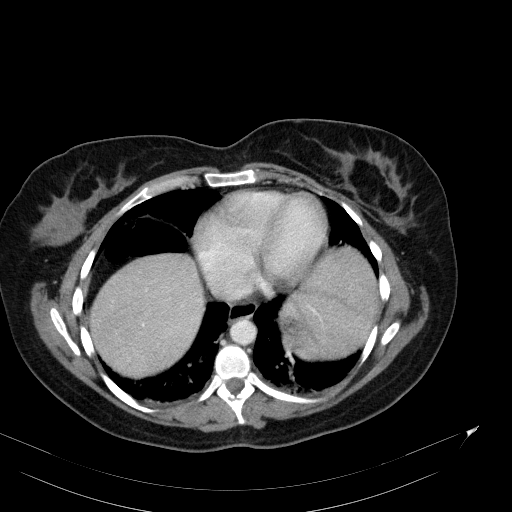
[im 97/107  lung]
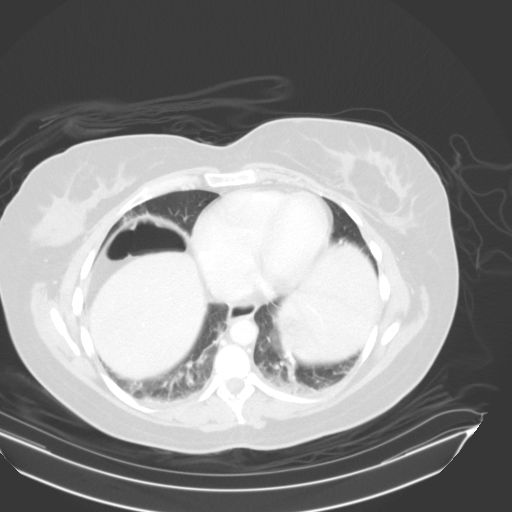

[11 of 30 positions shown; findings below may reference images not displayed]

FINDINGS: CTA CHEST FINDINGS

THORACIC INLET/BODY WALL:

Negative.

MEDIASTINUM:

Normal heart size. No pericardial effusion. No acute vascular
abnormality, including demonstrable pulmonary embolism or aortic
dissection. No adenopathy.

LUNG WINDOWS:

Bandlike opacities in the lower lungs consistent with atelectasis,
approaching segmental sized. No evidence for pneumonia, edema,
effusion, or pneumothorax. 2 small pulmonary nodules are noted in
the right middle lobe. The larger, measuring 5 mm on image 63 is
flat on reformatted imaging. A rounded nodule on image 64 measures 4
mm.

OSSEOUS:

No acute fracture.  No suspicious lytic or blastic lesions.

CT ABDOMEN and PELVIS FINDINGS

BODY WALL: Unremarkable.

Liver: No focal abnormality.

Biliary: No evidence of biliary obstruction or stone.

Pancreas: Unremarkable.

Spleen: Unremarkable.

Adrenals: Unremarkable.

Kidneys and ureters: No hydronephrosis or stone.

Bladder: Unremarkable.

Reproductive: There is edema, minimal peritoneal fluid, and mild
soft tissue gas within the pelvis which is expected 2 days post
hysterectomy. There is no measurable hematoma or evidence of
visceral injury. The ovaries are unremarkable.

Bowel: No obstruction. No pericecal inflammation.

Retroperitoneum: No mass or adenopathy.

Peritoneum: No ascites or pneumoperitoneum.

Vascular: No acute abnormality.

OSSEOUS: No acute abnormalities.

Review of the MIP images confirms the above findings.
IMPRESSION: 1. Negative for pulmonary embolism.
2. Bibasilar atelectasis.
3. Expected postoperative appearance of the pelvis.
4. 4 mm pulmonary nodule in the right middle lobe. If the patient is
at high risk for bronchogenic carcinoma, follow-up chest CT at 1
year is recommended. If the patient is at low risk, no follow-up is
needed.

## 2015-07-27 ENCOUNTER — Other Ambulatory Visit: Payer: BLUE CROSS/BLUE SHIELD

## 2015-08-03 ENCOUNTER — Ambulatory Visit
Admission: RE | Admit: 2015-08-03 | Discharge: 2015-08-03 | Disposition: A | Discharge status: Home / Self Care | Payer: BLUE CROSS/BLUE SHIELD | Source: Ambulatory Visit | Attending: Obstetrics and Gynecology | Admitting: Obstetrics and Gynecology

## 2015-08-03 DIAGNOSIS — N6489 Other specified disorders of breast: Secondary | ICD-10-CM

## 2015-10-31 ENCOUNTER — Other Ambulatory Visit: Payer: Self-pay | Admitting: Gynecologic Oncology

## 2015-11-03 ENCOUNTER — Telehealth: Payer: Self-pay

## 2015-11-03 NOTE — Telephone Encounter (Signed)
Orders received from [facility] ,APNP to contact the patient to schedule missed follow up with Dr Everitt Amber , due in August 2016 . Attempted to contcat the patient , no answer , left a detailed message with call back requested to schedule missed Aug 2016 follow up MD appointment. Patient also updated with refill request was approved for Estradiol 0.1 MG Patch ( once weekly) with 3 refills . Patient also updated with the need to follow up to continue refills as a physical with Dr Denman George is required for additional refills . Call back requested , GYN/ONC contact information given.

## 2015-11-03 NOTE — Telephone Encounter (Signed)
Refill faxed to patient's pharm

## 2017-06-13 ENCOUNTER — Encounter (HOSPITAL_COMMUNITY): Payer: Self-pay | Admitting: Obstetrics and Gynecology

## 2018-04-19 ENCOUNTER — Ambulatory Visit: Payer: BLUE CROSS/BLUE SHIELD | Admitting: Internal Medicine

## 2018-04-19 ENCOUNTER — Encounter: Payer: Self-pay | Admitting: Internal Medicine

## 2018-04-19 VITALS — BP 108/70 | HR 68 | Ht 64.0 in | Wt 145.0 lb

## 2018-04-19 DIAGNOSIS — R911 Solitary pulmonary nodule: Secondary | ICD-10-CM | POA: Diagnosis not present

## 2018-04-19 NOTE — Patient Instructions (Signed)
Please see patient coordinator before you leave today  to schedule ct s contrast and I will call you with results

## 2018-04-19 NOTE — Progress Notes (Signed)
Subjective:    Patient ID: Jillian Dennis, female    DOB: November 06, 1965   MRN: [id number]    Brief patient profile:  51yowf never smoker /no passive exp with good ex tol > hysterectomy > desats post op x 2 days > CT chest pos atx bases plus RML 6 mm nodule so referred to [facility] clinic 10/22/14 by Everett Graff     History of Present Illness  10/22/2014 1st [facility] office visit/ Shravan Salahuddin   Chief Complaint  Patient presents with  . Pulmonary Consult    Referred by Dr. Everett Graff. Pt states that after having hysterectomy on 10/13/14 she was advised her o2 sats dropped. CT chest was then performed 10/14/14 that was sig for pulmonary nodule.    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 Pulmonary Consult    Last seen 10/22/14 for pulmonary nodule. She is here to f/u on the nodule. No new co's today.    Dyspnea:  Walking 4-5 x miles 4-5 times were week some inclines, no sob Cough: no   Grandfather mat lung ca smoker  Was thought to have RA at one point but rheum said no and symptoms resolved off statins.   No  assoc excess/ purulent sputum or mucus plugs or hemoptysis or cp or chest tightness, subjective wheeze or overt sinus or hb symptoms. No unusual exposure hx or h/o childhood pna/ asthma or knowledge of premature birth.  Sleeping  Fine flat without nocturnal  or early am exacerbation  of respiratory  c/o's or need for noct saba. Also denies any obvious fluctuation of symptoms with weather or environmental changes or other aggravating or alleviating factors except as outlined above   Current Allergies, Complete Past Medical History, Past Surgical History, Family History, and Social History were reviewed in [employer] record.  ROS  The following are not active complaints unless bolded Hoarseness, sore throat, dysphagia, dental problems, itching, sneezing,  nasal congestion or discharge of excess mucus or purulent secretions, ear ache,   fever, chills, sweats, unintended wt loss or wt gain, classically pleuritic or exertional cp,  orthopnea pnd or arm/hand swelling  or leg swelling, presyncope, palpitations, abdominal pain, anorexia, nausea, vomiting, diarrhea  or change in bowel habits or change in bladder habits, change in stools or change in urine, dysuria, hematuria,  rash, arthralgias, visual complaints, headache, numbness, weakness or ataxia or problems with walking or coordination,  change in mood or  memory.        Current Meds  Medication Sig  . Calcium Carbonate-Vit D-Min (CALCIUM 1200) 1200-1000 MG-UNIT CHEW Chew 1 tablet by mouth daily.  Marland Kitchen estradiol (CLIMARA - DOSED IN MG/24 HR) 0.1 mg/24hr patch PLACE 1 PATCH ONTO THE SKIN ONCE A WEEK  . Multiple Vitamins-Minerals (MULTIVITAMIN WITH MINERALS) tablet Take 1 tablet by mouth daily.  . Omega-3 Fatty Acids (FISH OIL) 1000 MG CAPS Take 1 capsule by mouth daily.                    Objective:   Physical Exam  amb pleasant wf nad    04/19/2018       145   10/22/14 146 lb (66.225 kg)  10/22/14  145 lb 4.8 oz (65.908 kg)  10/13/14 145 lb (65.772 kg)     Vital signs reviewed - Note on arrival 02 sats  100% on RA   HEENT: nl dentition, turbinates bilaterally, and oropharynx. Nl external ear canals without cough reflex   NECK :  without JVD/Nodes/TM/ nl carotid upstrokes bilaterally   LUNGS: no acc muscle use,  Nl contour chest which is clear to A and P bilaterally without cough on insp or exp maneuvers   CV:  RRR  no s3 or murmur or increase in P2, and no edema   ABD:  soft and nontender with nl inspiratory excursion in the supine position. No bruits or organomegaly appreciated, bowel sounds nl  MS:  Nl gait/ ext warm without  deformities, calf tenderness, cyanosis or clubbing No obvious joint restrictions   SKIN: warm and dry without lesions    NEURO:  alert, approp, nl sensorium with  no motor or cerebellar deficits apparent.           Assessment & Plan:

## 2018-04-20 ENCOUNTER — Ambulatory Visit (INDEPENDENT_AMBULATORY_CARE_PROVIDER_SITE_OTHER): Payer: BLUE CROSS/BLUE SHIELD

## 2018-04-20 ENCOUNTER — Encounter: Payer: Self-pay | Admitting: Internal Medicine

## 2018-04-20 DIAGNOSIS — R911 Solitary pulmonary nodule: Secondary | ICD-10-CM

## 2018-04-20 NOTE — Assessment & Plan Note (Signed)
-   never smoker, no additional risk factors  - See CT chest 10/14/14 MPN's x up to 5 mm  > 04/21/2015 no change    - CT chest ordered s contrast 04/19/2018 >>>   The radiologist felt these lesions were benign but they were borderline (71mm would have been the threshold for more aggressive f/u) at 6 months f/u so to be completely sure would rec one single f/u ct now though did discuss with pt we may fine new ones for which the fleischner society guidelines would apply (false positives common in pts who've never smoked and don't have underlying RA, which was suspected at one point but ruled out)   Discussed in detail all the  indications, usual  risks and alternatives  relative to the benefits with patient who agrees to proceed with w/u as outlined.     Total time devoted to counseling  > 50 % of initial 40 min office visit:  review case with pt/ discussion of options/alternatives/ personally creating written customized instructions  in presence of pt  then going over those specific  Instructions directly with the pt including how to use all of the meds but in particular covering each new medication in detail and the difference between the maintenance= "automatic" meds and the prns using an action plan format for the latter (If this problem/symptom => do that organization reading Left to right).  Please see AVS from this visit for a full list of these instructions which I personally wrote for this pt and  are unique to this visit.

## 2018-04-21 ENCOUNTER — Encounter: Payer: Self-pay | Admitting: Internal Medicine

## 2018-04-23 NOTE — Progress Notes (Signed)
Spoke with pt and notified of results per Dr. Wert. Pt verbalized understanding and denied any questions. 

## 2018-04-24 ENCOUNTER — Telehealth: Payer: Self-pay | Admitting: Internal Medicine

## 2018-04-24 NOTE — Telephone Encounter (Signed)
Spoke with [facility] and went over the referral with them that Malcolm faxed. We could not figure out why this referral was sent under our name and clearly came to a conclusion that she did not need these services and was only seen by [facility] for a pulmonary nodule. I advised Crystal to disregard referral. FYI [facility].

## 2018-04-24 NOTE — Telephone Encounter (Signed)
Spoke with [employer], she states they received a fax referral from our office to see Dr. Jearld Lesch but I didn't see any record of this referral in the chart and the reasoning why she should see Dr. Owens Shark. Dr. Owens Shark doesn't start until August and the pt's BMI does not qualify her for the program. I asked Crystal if she could fax over the referral that was sent to her so I can figure out what is going on. Will await fax.

## 2019-01-31 ENCOUNTER — Other Ambulatory Visit: Payer: Self-pay | Admitting: Gastroenterology

## 2019-01-31 ENCOUNTER — Other Ambulatory Visit (HOSPITAL_COMMUNITY): Payer: Self-pay | Admitting: Gastroenterology

## 2019-01-31 DIAGNOSIS — R1011 Right upper quadrant pain: Secondary | ICD-10-CM

## 2019-02-05 ENCOUNTER — Ambulatory Visit (HOSPITAL_COMMUNITY)
Admission: RE | Admit: 2019-02-05 | Discharge: 2019-02-05 | Disposition: A | Discharge status: Home / Self Care | Payer: BLUE CROSS/BLUE SHIELD | Source: Ambulatory Visit | Attending: Gastroenterology | Admitting: Gastroenterology

## 2019-02-05 ENCOUNTER — Other Ambulatory Visit: Payer: Self-pay

## 2019-02-05 DIAGNOSIS — R1011 Right upper quadrant pain: Secondary | ICD-10-CM

## 2019-02-05 MED ORDER — TECHNETIUM TC 99M MEBROFENIN IV KIT
5.2000 | PACK | Freq: Once | INTRAVENOUS | Status: AC | PRN
  Administered 2019-02-05: 5.2 via INTRAVENOUS

## 2019-10-24 IMAGING — US ULTRASOUND ABDOMEN LIMITED
1 series · 14 of 25 positions shown · non-contrast
Comparison: None.

CLINICAL DATA: Right upper quadrant pain for 6 months.

EXAM:
ULTRASOUND ABDOMEN LIMITED RIGHT UPPER QUADRANT

[Series 1: ultrasound abdomen limited · 14 of 47 slices shown]
[im 1/47]
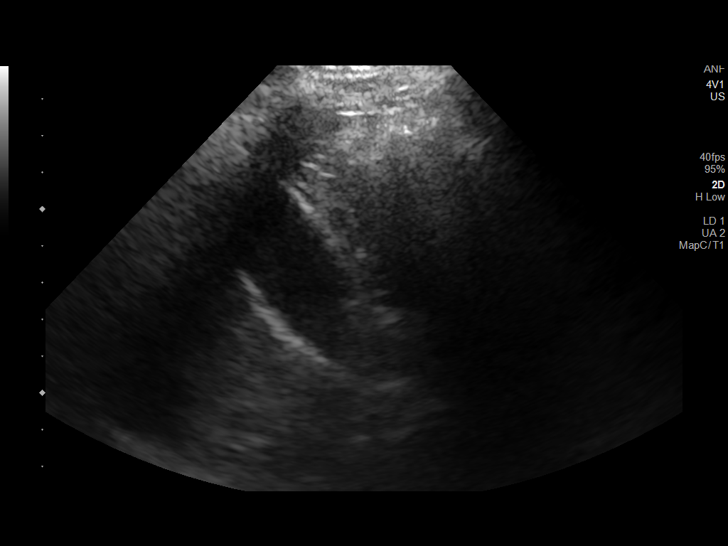
[im 4/47]
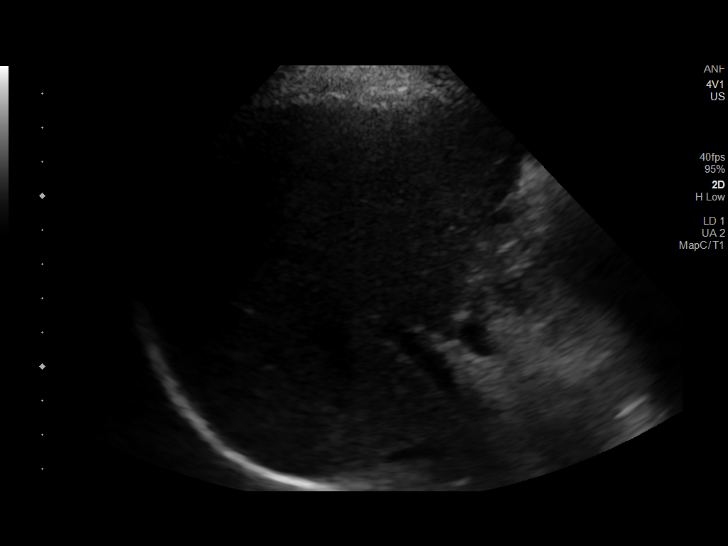
[im 8/47]
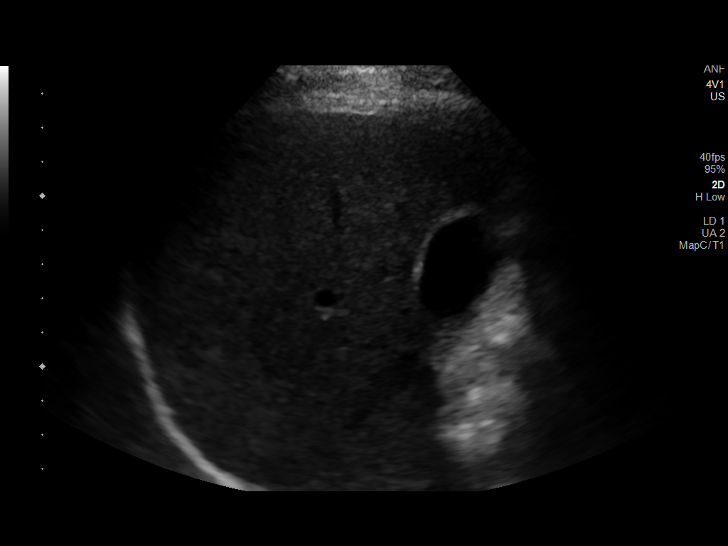
[im 12/47]
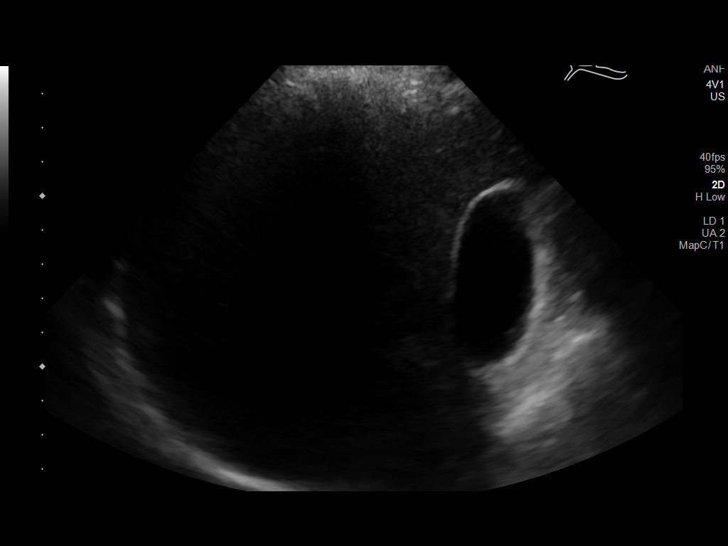
[im 16/47]
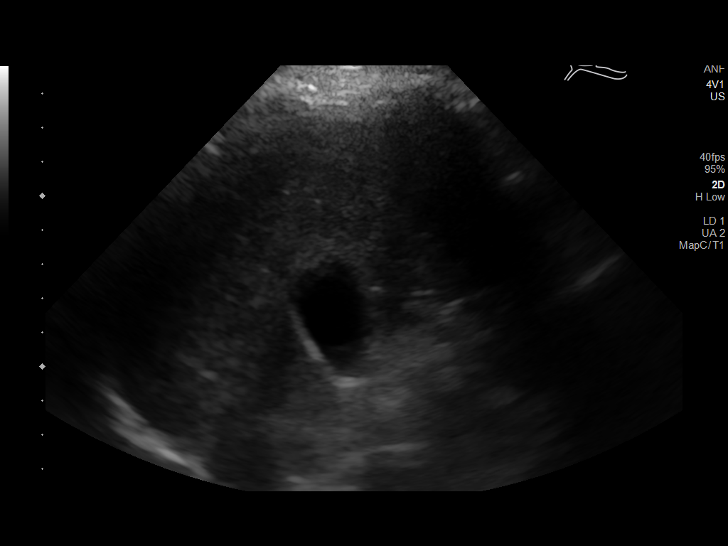
[im 18/47]
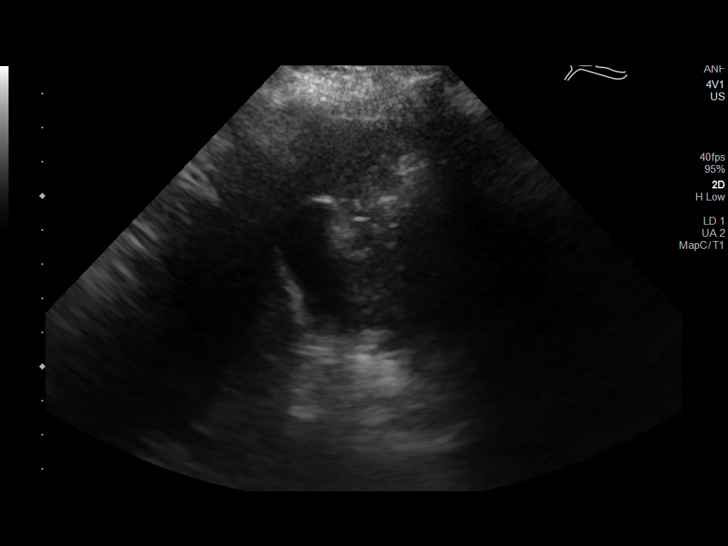
[im 22/47]
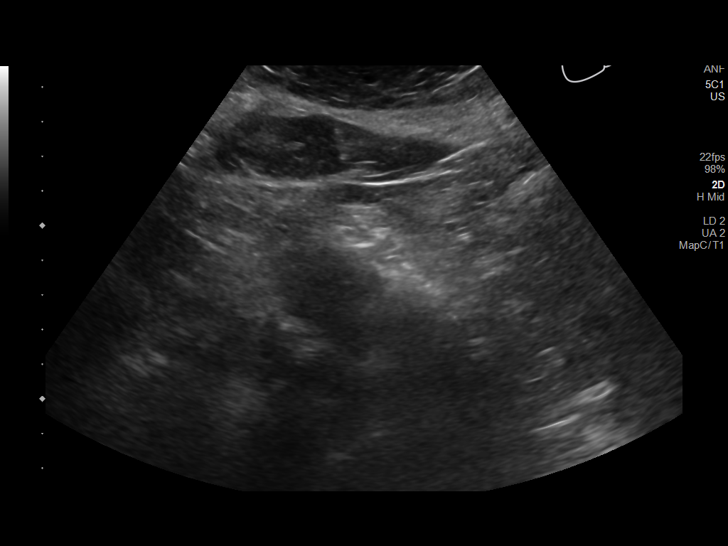
[im 25/47]
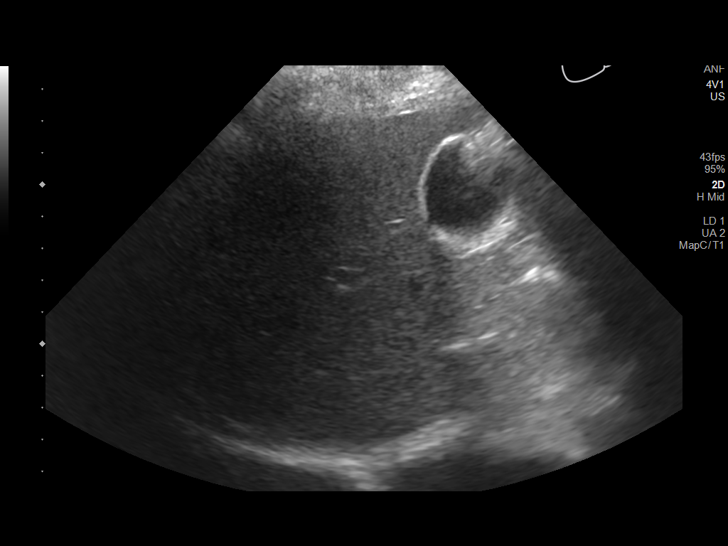
[im 29/47]
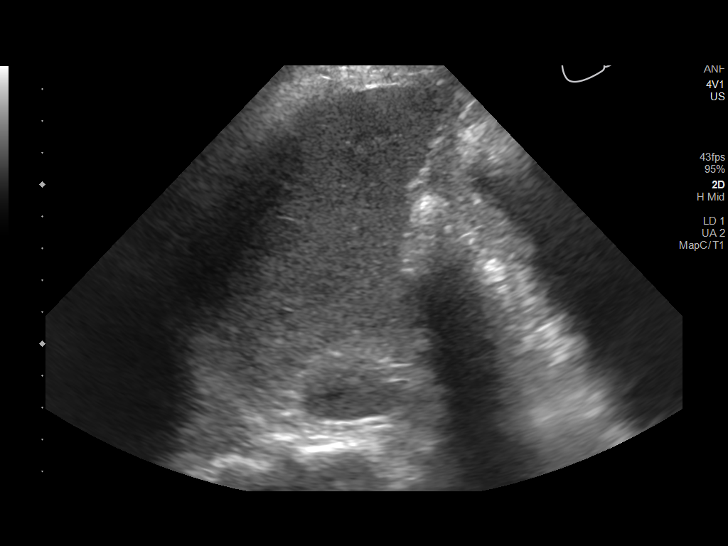
[im 31/47]
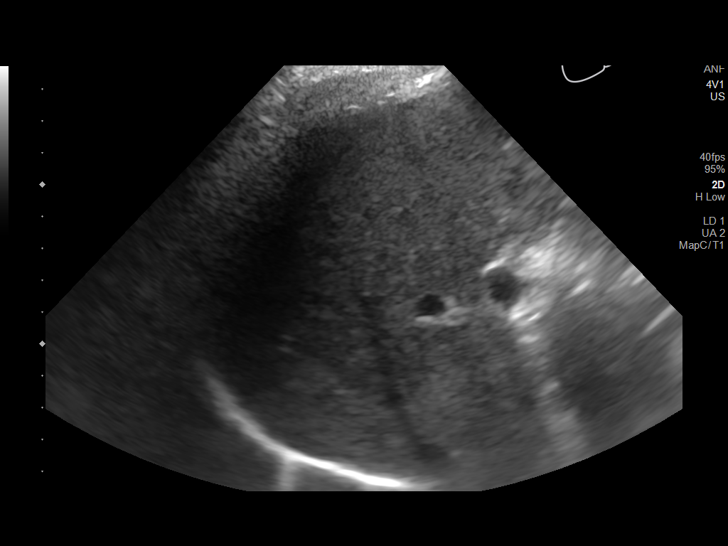
[im 35/47]
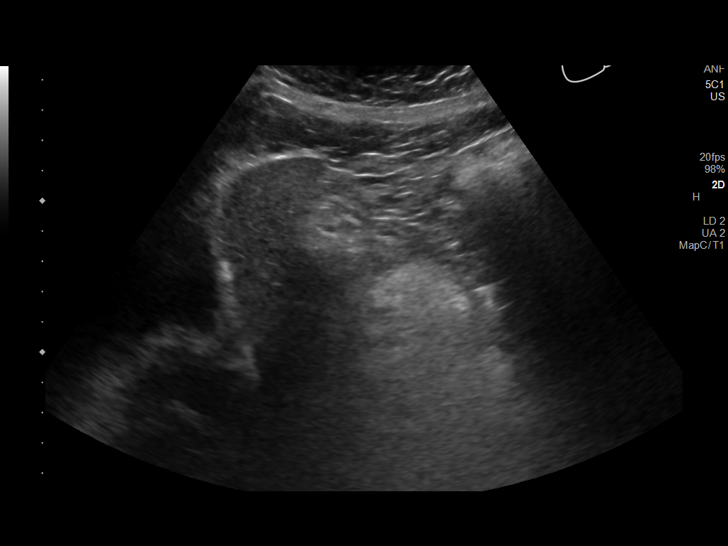
[im 39/47]
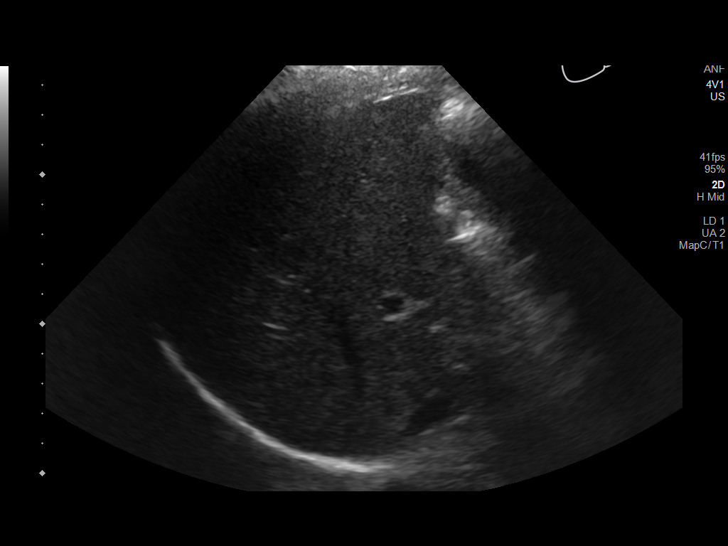
[im 43/47]
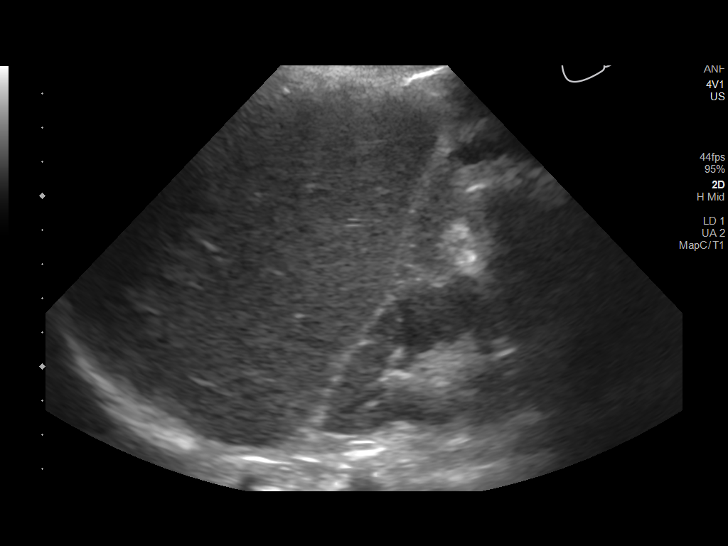
[im 47/47]
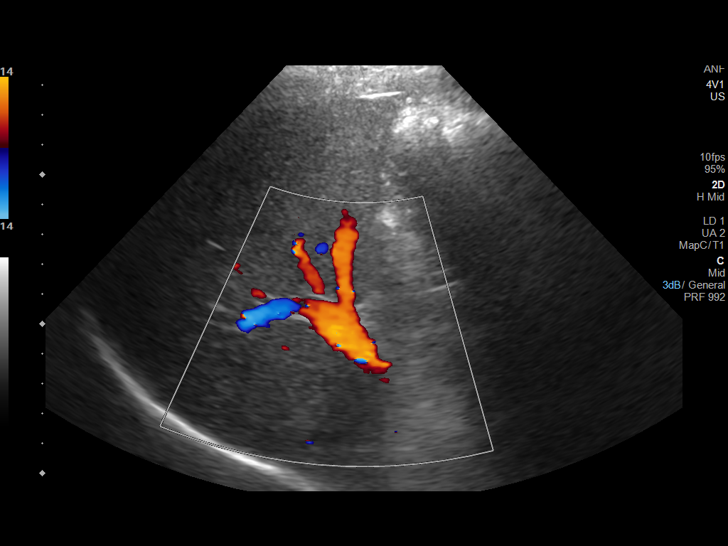

[14 of 25 positions shown; findings below may reference images not displayed]

FINDINGS: Gallbladder:

No gallstones or wall thickening visualized. No sonographic Murphy
sign noted by sonographer.

Common bile duct:

Diameter: 0.2 cm.

Liver:

No focal lesion identified. Within normal limits in parenchymal
echogenicity. Portal vein is patent on color Doppler imaging with
normal direction of blood flow towards the liver.
IMPRESSION: Negative for gallstones.  Normal exam.

## 2019-10-24 IMAGING — NM NUCLEAR MEDICINE HEPATOBILIARY IMAGING WITH GALLBLADDER EF
2 series · 12 of 12 positions shown · non-contrast
Comparison: None.

CLINICAL DATA: Right upper quadrant pain

EXAM:
NUCLEAR MEDICINE HEPATOBILIARY IMAGING WITH GALLBLADDER EF
TECHNIQUE: Sequential images of the abdomen were obtained [DATE] minutes
following intravenous administration of radiopharmaceutical. After
oral ingestion of Ensure, gallbladder ejection fraction was
determined. At 60 min, normal ejection fraction is greater than 33%.
RADIOPHARMACEUTICALS:  5.2 mCi Qc-TTm  Choletec IV

[he hepatobiliary · 4.52mm/px · 6 of 60 frames shown (1 of 2)]
[frame 6/60]
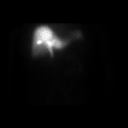
[frame 16/60]
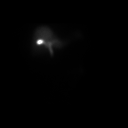
[frame 26/60]
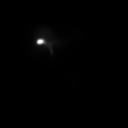
[frame 36/60]
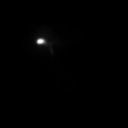
[frame 46/60]
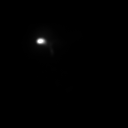
[frame 56/60]
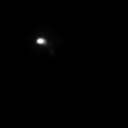

[he hepatobiliary · 4.52mm/px · 6 of 60 frames shown (2 of 2)]
[frame 6/60]
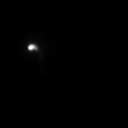
[frame 16/60]
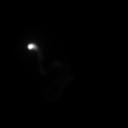
[frame 26/60]
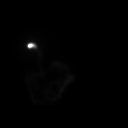
[frame 36/60]
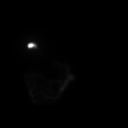
[frame 46/60]
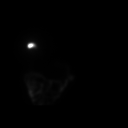
[frame 56/60]
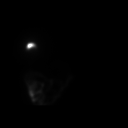

[12 of 12 positions shown; findings below may reference images not displayed]

FINDINGS: Prompt uptake and biliary excretion of activity by the liver is
seen. Gallbladder activity is visualized, consistent with patency of
cystic duct. Biliary activity passes into small bowel, consistent
with patent common bile duct.

Calculated gallbladder ejection fraction is 46%. (Normal gallbladder
ejection fraction with Ensure is greater than 33%.)
IMPRESSION: Normal hepatobiliary scan and gallbladder ejection fraction.

## 2021-11-10 ENCOUNTER — Other Ambulatory Visit: Payer: Self-pay | Admitting: Family Medicine

## 2021-11-10 DIAGNOSIS — K200 Eosinophilic esophagitis: Secondary | ICD-10-CM

## 2021-11-10 DIAGNOSIS — N951 Menopausal and female climacteric states: Secondary | ICD-10-CM

## 2021-11-10 DIAGNOSIS — I7090 Unspecified atherosclerosis: Secondary | ICD-10-CM

## 2021-11-10 DIAGNOSIS — Z8249 Family history of ischemic heart disease and other diseases of the circulatory system: Secondary | ICD-10-CM

## 2021-11-10 DIAGNOSIS — E559 Vitamin D deficiency, unspecified: Secondary | ICD-10-CM

## 2021-11-10 DIAGNOSIS — E782 Mixed hyperlipidemia: Secondary | ICD-10-CM

## 2022-07-06 DIAGNOSIS — J3089 Other allergic rhinitis: Secondary | ICD-10-CM | POA: Diagnosis not present

## 2022-07-06 DIAGNOSIS — J301 Allergic rhinitis due to pollen: Secondary | ICD-10-CM | POA: Diagnosis not present

## 2022-07-20 DIAGNOSIS — J3089 Other allergic rhinitis: Secondary | ICD-10-CM | POA: Diagnosis not present

## 2022-07-20 DIAGNOSIS — J301 Allergic rhinitis due to pollen: Secondary | ICD-10-CM | POA: Diagnosis not present

## 2022-07-26 DIAGNOSIS — M67431 Ganglion, right wrist: Secondary | ICD-10-CM | POA: Diagnosis not present

## 2022-08-03 DIAGNOSIS — J301 Allergic rhinitis due to pollen: Secondary | ICD-10-CM | POA: Diagnosis not present

## 2022-08-03 DIAGNOSIS — J3089 Other allergic rhinitis: Secondary | ICD-10-CM | POA: Diagnosis not present

## 2022-08-17 DIAGNOSIS — J3089 Other allergic rhinitis: Secondary | ICD-10-CM | POA: Diagnosis not present

## 2022-08-17 DIAGNOSIS — J301 Allergic rhinitis due to pollen: Secondary | ICD-10-CM | POA: Diagnosis not present

## 2022-08-25 DIAGNOSIS — H16232 Neurotrophic keratoconjunctivitis, left eye: Secondary | ICD-10-CM | POA: Diagnosis not present

## 2022-08-25 DIAGNOSIS — H16221 Keratoconjunctivitis sicca, not specified as Sjogren's, right eye: Secondary | ICD-10-CM | POA: Diagnosis not present

## 2022-08-25 DIAGNOSIS — H16222 Keratoconjunctivitis sicca, not specified as Sjogren's, left eye: Secondary | ICD-10-CM | POA: Diagnosis not present

## 2022-08-25 DIAGNOSIS — H16231 Neurotrophic keratoconjunctivitis, right eye: Secondary | ICD-10-CM | POA: Diagnosis not present

## 2022-11-14 DIAGNOSIS — H16233 Neurotrophic keratoconjunctivitis, bilateral: Secondary | ICD-10-CM | POA: Diagnosis not present

## 2022-11-14 DIAGNOSIS — H16223 Keratoconjunctivitis sicca, not specified as Sjogren's, bilateral: Secondary | ICD-10-CM | POA: Diagnosis not present

## 2022-11-14 DIAGNOSIS — H16222 Keratoconjunctivitis sicca, not specified as Sjogren's, left eye: Secondary | ICD-10-CM | POA: Diagnosis not present

## 2022-11-14 DIAGNOSIS — H16231 Neurotrophic keratoconjunctivitis, right eye: Secondary | ICD-10-CM | POA: Diagnosis not present

## 2022-11-14 DIAGNOSIS — H16232 Neurotrophic keratoconjunctivitis, left eye: Secondary | ICD-10-CM | POA: Diagnosis not present

## 2022-11-14 DIAGNOSIS — H11823 Conjunctivochalasis, bilateral: Secondary | ICD-10-CM | POA: Diagnosis not present

## 2022-11-14 DIAGNOSIS — H16221 Keratoconjunctivitis sicca, not specified as Sjogren's, right eye: Secondary | ICD-10-CM | POA: Diagnosis not present

## 2022-11-23 DIAGNOSIS — L820 Inflamed seborrheic keratosis: Secondary | ICD-10-CM | POA: Diagnosis not present

## 2022-11-23 DIAGNOSIS — L814 Other melanin hyperpigmentation: Secondary | ICD-10-CM | POA: Diagnosis not present

## 2022-11-23 DIAGNOSIS — D225 Melanocytic nevi of trunk: Secondary | ICD-10-CM | POA: Diagnosis not present

## 2022-11-23 DIAGNOSIS — L821 Other seborrheic keratosis: Secondary | ICD-10-CM | POA: Diagnosis not present

## 2022-11-23 DIAGNOSIS — L281 Prurigo nodularis: Secondary | ICD-10-CM | POA: Diagnosis not present

## 2022-11-23 DIAGNOSIS — D485 Neoplasm of uncertain behavior of skin: Secondary | ICD-10-CM | POA: Diagnosis not present

## 2022-12-22 DIAGNOSIS — Z8544 Personal history of malignant neoplasm of other female genital organs: Secondary | ICD-10-CM | POA: Diagnosis not present

## 2022-12-22 DIAGNOSIS — Z01419 Encounter for gynecological examination (general) (routine) without abnormal findings: Secondary | ICD-10-CM | POA: Diagnosis not present

## 2022-12-22 DIAGNOSIS — Z1231 Encounter for screening mammogram for malignant neoplasm of breast: Secondary | ICD-10-CM | POA: Diagnosis not present

## 2023-01-31 DIAGNOSIS — H11823 Conjunctivochalasis, bilateral: Secondary | ICD-10-CM | POA: Diagnosis not present

## 2023-01-31 DIAGNOSIS — H16233 Neurotrophic keratoconjunctivitis, bilateral: Secondary | ICD-10-CM | POA: Diagnosis not present

## 2023-01-31 DIAGNOSIS — H16223 Keratoconjunctivitis sicca, not specified as Sjogren's, bilateral: Secondary | ICD-10-CM | POA: Diagnosis not present

## 2023-04-28 ENCOUNTER — Ambulatory Visit (INDEPENDENT_AMBULATORY_CARE_PROVIDER_SITE_OTHER): Payer: BC Managed Care – PPO

## 2023-04-28 ENCOUNTER — Encounter: Payer: Self-pay | Admitting: Podiatry

## 2023-04-28 ENCOUNTER — Ambulatory Visit: Payer: BC Managed Care – PPO | Admitting: Podiatry

## 2023-04-28 DIAGNOSIS — M722 Plantar fascial fibromatosis: Secondary | ICD-10-CM | POA: Diagnosis not present

## 2023-04-28 DIAGNOSIS — M79672 Pain in left foot: Secondary | ICD-10-CM

## 2023-04-28 DIAGNOSIS — M79671 Pain in right foot: Secondary | ICD-10-CM | POA: Diagnosis not present

## 2023-04-28 DIAGNOSIS — M76822 Posterior tibial tendinitis, left leg: Secondary | ICD-10-CM

## 2023-04-28 DIAGNOSIS — M7732 Calcaneal spur, left foot: Secondary | ICD-10-CM | POA: Diagnosis not present

## 2023-04-28 DIAGNOSIS — M19071 Primary osteoarthritis, right ankle and foot: Secondary | ICD-10-CM | POA: Diagnosis not present

## 2023-04-28 MED ORDER — MELOXICAM 15 MG PO TABS: 15.0000 mg | ORAL_TABLET | Freq: Every day | ORAL | 0 refills | Status: DC

## 2023-04-28 NOTE — Patient Instructions (Signed)
Posterior Tibial Tendinitis Rehab Ask your health care provider which exercises are safe for you. Do exercises exactly as told by your provider and adjust them as directed. It's normal to feel mild stretching, pulling, tightness, or discomfort as you do these exercises. Stop right away if you feel sudden pain or your pain gets worse. Do not begin these exercises until told by your provider. Stretching and range-of-motion exercises These exercises warm up your muscles and joints and improve the movement and flexibility in your ankle and foot. These exercises may also help to relieve pain. Standing wall calf stretch, knee straight  Stand with your hands against a wall. Extend your left / right leg behind you, and bend your front knee slightly. If told, place a folded washcloth under the arch of your foot for support. Point the toes of your back foot slightly inward. Keeping your heels on the floor and your back knee straight, shift your weight toward the wall. Do not allow your back to arch. You should feel a gentle stretch in your upper left / right calf. Hold this position for __________ seconds. Repeat __________ times. Complete this exercise __________ times a day. Standing wall calf stretch, knee bent  Stand with your hands against a wall. Extend your left / right leg behind you, and bend your front knee slightly. If told, place a folded washcloth under the arch of your foot for support. Point the toes of your back foot slightly inward. Unlock your back knee so it's bent. Keep your heels on the floor. You should feel a gentle stretch deep in your lower left / right calf. Hold this position for __________ seconds. Repeat __________ times. Complete this exercise __________ times a day. Strengthening exercises These exercises build strength and endurance in your ankle and foot. Endurance is the ability to use your muscles for a long time, even after they get tired. Ankle inversion with  band  Secure one end of a rubber exercise band or tubing to a fixed object, such as a table leg or a pole, that will stay still when the band is pulled. Loop the other end of the band around the middle of your left / right foot. Sit on the floor facing the object with your left / right leg extended. The band or tube should be slightly tense when your foot is relaxed. Leading with your big toe, slowly bring your left / right foot and ankle inward, toward your other foot (inversion). Hold this position for __________ seconds. Slowly return your foot to the starting position. Repeat __________ times. Complete this exercise __________ times a day. Towel curls  Sit in a chair on a non-carpeted surface, and put your feet on the floor. Place a towel in front of your feet. If told by your provider, add a __________ weight to the end of the towel. Keeping your heel on the floor, put your left / right foot on the towel. Pull the towel toward you by grabbing the towel with your toes and curling them under. Keep your heel on the floor while you do this. Let your toes relax. Grab the towel with your toes again. Keep going until the towel is completely underneath your foot. Repeat __________ times. Complete this exercise __________ times a day. Balance exercise This exercise improves or maintains your balance. Balance is important in preventing falls. Single leg stand  Without wearing shoes, stand near a railing or in a doorway. You may hold on to the railing or   doorframe as needed for balance. Stand on your left / right foot. Keep your big toe down on the floor and try to keep your arch lifted. If balancing in this position is too easy, try the exercise with your eyes closed or while standing on a pillow. Hold this position for __________ seconds. Repeat __________ times. Complete this exercise __________ times a day. This information is not intended to replace advice given to you by your health care  provider. Make sure you discuss any questions you have with your health care provider. Document Revised: 10/19/2022 Document Reviewed: 10/19/2022 Elsevier Patient Education  2024 Elsevier Inc.  

## 2023-04-28 NOTE — Progress Notes (Signed)
  Subjective:  Patient ID: Jillian Dennis, female    DOB: 1966/02/07,   MRN: [id number]  Chief Complaint  Patient presents with   Plantar Fasciitis    Bilateral PF  foot pain , left worse than right     57 y.o. female presents for concern of bilateral foot and heel pain more so on the left than right. Has had PF in the past and done stretches and worn support. Relates this time is different. It has been going on for several months. Relates more pain in her ankles. Hurts with initial steps and being on for a while. Takes natural antiinflammatory with little relief . Denies any other pedal complaints. Denies n/v/f/c.   Past Medical History:  Diagnosis Date   Complication of anesthesia    breathing problems after surgery in Dec.- from positionong during surgery and pressure on lungs   Hyperlipidemia    diet and exercise controlled - no med   SVD (spontaneous vaginal delivery)    x 2    Objective:  Physical Exam: Vascular: DP/PT pulses 2/4 bilateral. CFT <3 seconds. Normal hair growth on digits. No edema.  Skin. No lacerations or abrasions bilateral feet.  Musculoskeletal: MMT 5/5 bilateral lower extremities in DF, PF, Inversion and Eversion. Deceased ROM in DF of ankle joint. Tender to insertion of PT tendon on the left and some pain tracking proximally along tendon. Mildy tender to medial calcaneal tubercle bilateral. Some tenderness to posterior achilles bilateral at insertion site. No pain along arch. No pain with inversion eversion PF or Df.  Neurological: Sensation intact to light touch.   Assessment:   1. Posterior tibial tendon dysfunction (PTTD) of left lower extremity   2. Plantar fasciitis, bilateral      Plan:  Patient was evaluated and treated and all questions answered. X-rays reviewed and discussed with patient. No acute fractures or dislocation noted. Mild spurring noted to inferior calcaneus. Pes planus noted more so on left.  Discussed PTTD diagnosis and treatment  options with patient. Stretching exercises discussed and handout dispensed. Prescription for meloxicam provided. Tri-Lock ankle brace dispensed. Powersteps dispensed.  Discussed if there is no improvement PT/MRI/injection may be an option. Patient to return to clinic in 6 to 8 weeks or sooner if symptoms fail to improve or worsen.   Louann Sjogren, DPM

## 2023-05-25 ENCOUNTER — Other Ambulatory Visit: Payer: Self-pay | Admitting: Podiatry

## 2023-06-08 ENCOUNTER — Ambulatory Visit: Payer: BC Managed Care – PPO | Admitting: Podiatry

## 2023-06-29 ENCOUNTER — Ambulatory Visit: Payer: BC Managed Care – PPO | Admitting: Podiatry

## 2023-06-29 DIAGNOSIS — M76822 Posterior tibial tendinitis, left leg: Secondary | ICD-10-CM | POA: Diagnosis not present

## 2023-06-29 DIAGNOSIS — M722 Plantar fascial fibromatosis: Secondary | ICD-10-CM | POA: Diagnosis not present

## 2023-06-29 DIAGNOSIS — B070 Plantar wart: Secondary | ICD-10-CM | POA: Diagnosis not present

## 2023-06-29 NOTE — Progress Notes (Signed)
  Subjective:  Patient ID: Jillian Dennis, female    DOB: August 19, 1966,   MRN: [id number]  No chief complaint on file.   57 y.o. female presents for follow-up of left PTTD. Relates she is doing about 90% better and the brace has helped. Relates did not like taking meloxicam and she has been stretching. Relates she would like to try orthotics to prevent issues in the future. She also relates a new problem of a wart on her right heel. Relates dermatology did try to freeze off but it is still present. Wondering about treatments.  Denies any other pedal complaints. Denies n/v/f/c.   Past Medical History:  Diagnosis Date   Complication of anesthesia    breathing problems after surgery in Dec.- from positionong during surgery and pressure on lungs   Hyperlipidemia    diet and exercise controlled - no med   SVD (spontaneous vaginal delivery)    x 2    Objective:  Physical Exam: Vascular: DP/PT pulses 2/4 bilateral. CFT <3 seconds. Normal hair growth on digits. No edema.  Skin. No lacerations or abrasions bilateral feet. Right plantar heel lesion about 1 cm. Multiple capillary budding is noted throughout with cauliflower-like appearance and loss of skin tension lines within the lesion itself. Musculoskeletal: MMT 5/5 bilateral lower extremities in DF, PF, Inversion and Eversion. Deceased ROM in DF of ankle joint. Minimally tender to insertion of PT tendon on the leftn. Mildy tender to medial calcaneal tubercle bilateral. Some tenderness to posterior achilles bilateral at insertion site. No pain along arch. No pain with inversion eversion PF or Df.  Neurological: Sensation intact to light touch.   Assessment:   1. Posterior tibial tendon dysfunction (PTTD) of left lower extremity   2. Plantar fasciitis, bilateral   3. Plantar wart, right foot       Plan:  Patient was evaluated and treated and all questions answered. X-rays reviewed and discussed with patient. No acute fractures or dislocation  noted. Mild spurring noted to inferior calcaneus. Pes planus noted more so on left.  Discussed PTTD diagnosis and treatment options with patient. Continue stretching and anti-inflammatories.  Patient would like to proceed with orthotics and will get appointment for fitting.   -Discussed warts and their etiology with patient and treatment options.  -Hyperkeratotic tissue was debrided with chisel without incident. -Application of cantrhone provided today. Advised patient to remove bandaging tomorrow.  -Discussed future options such as laser treatment if unsuccessful.  -Advised good supportive shoes and inserts -Patient to return to office if continued problems in future.    Louann Sjogren, DPM

## 2023-07-16 DIAGNOSIS — M549 Dorsalgia, unspecified: Secondary | ICD-10-CM | POA: Diagnosis not present

## 2023-07-16 DIAGNOSIS — M545 Low back pain, unspecified: Secondary | ICD-10-CM | POA: Diagnosis not present

## 2023-07-16 DIAGNOSIS — M47816 Spondylosis without myelopathy or radiculopathy, lumbar region: Secondary | ICD-10-CM | POA: Diagnosis not present

## 2023-07-27 ENCOUNTER — Other Ambulatory Visit: Payer: BC Managed Care – PPO

## 2023-08-24 ENCOUNTER — Ambulatory Visit: Payer: BC Managed Care – PPO

## 2023-08-24 NOTE — Progress Notes (Signed)
Orthotic eval   Patient was seen, measured / scanned for custom molded foot orthotics.  Patient will benefit from CFO's as they will help provide total contact to MLA's helping to better distribute body weight across BIL feet greater reducing plantar pressure and pain and to also encourage FF and RF alignment.  Patient was scanned items to be ordered and fit when in

## 2023-10-12 ENCOUNTER — Ambulatory Visit (INDEPENDENT_AMBULATORY_CARE_PROVIDER_SITE_OTHER): Payer: BC Managed Care – PPO

## 2023-10-12 DIAGNOSIS — M76822 Posterior tibial tendinitis, left leg: Secondary | ICD-10-CM | POA: Diagnosis not present

## 2023-10-12 DIAGNOSIS — M722 Plantar fascial fibromatosis: Secondary | ICD-10-CM

## 2023-10-12 DIAGNOSIS — M2142 Flat foot [pes planus] (acquired), left foot: Secondary | ICD-10-CM

## 2023-10-12 DIAGNOSIS — M2141 Flat foot [pes planus] (acquired), right foot: Secondary | ICD-10-CM | POA: Diagnosis not present

## 2023-10-12 NOTE — Progress Notes (Signed)
Patient presents today to pick up custom molded foot orthotics, diagnosed with PF by Dr. Woodroe Chen.   Orthotics were dispensed and fit was satisfactory. Reviewed instructions for break-in and wear. Written instructions given to patient.  Patient will follow up as needed. Addison Bailey Cped, CFo, CFm

## 2023-10-30 DIAGNOSIS — H11823 Conjunctivochalasis, bilateral: Secondary | ICD-10-CM | POA: Diagnosis not present

## 2023-10-30 DIAGNOSIS — H16223 Keratoconjunctivitis sicca, not specified as Sjogren's, bilateral: Secondary | ICD-10-CM | POA: Diagnosis not present

## 2023-10-30 DIAGNOSIS — H16233 Neurotrophic keratoconjunctivitis, bilateral: Secondary | ICD-10-CM | POA: Diagnosis not present

## 2023-11-22 DIAGNOSIS — L821 Other seborrheic keratosis: Secondary | ICD-10-CM | POA: Diagnosis not present

## 2023-11-22 DIAGNOSIS — D225 Melanocytic nevi of trunk: Secondary | ICD-10-CM | POA: Diagnosis not present

## 2023-11-22 DIAGNOSIS — D485 Neoplasm of uncertain behavior of skin: Secondary | ICD-10-CM | POA: Diagnosis not present

## 2023-11-22 DIAGNOSIS — L820 Inflamed seborrheic keratosis: Secondary | ICD-10-CM | POA: Diagnosis not present

## 2023-11-22 DIAGNOSIS — L309 Dermatitis, unspecified: Secondary | ICD-10-CM | POA: Diagnosis not present

## 2023-11-22 DIAGNOSIS — L814 Other melanin hyperpigmentation: Secondary | ICD-10-CM | POA: Diagnosis not present

## 2023-11-22 DIAGNOSIS — L649 Androgenic alopecia, unspecified: Secondary | ICD-10-CM | POA: Diagnosis not present

## 2024-01-08 DIAGNOSIS — N8189 Other female genital prolapse: Secondary | ICD-10-CM | POA: Diagnosis not present

## 2024-01-08 DIAGNOSIS — Z4003 Encounter for prophylactic removal of fallopian tube(s): Secondary | ICD-10-CM | POA: Diagnosis not present

## 2024-01-08 DIAGNOSIS — Z760 Encounter for issue of repeat prescription: Secondary | ICD-10-CM | POA: Diagnosis not present

## 2024-01-08 DIAGNOSIS — Z01419 Encounter for gynecological examination (general) (routine) without abnormal findings: Secondary | ICD-10-CM | POA: Diagnosis not present

## 2024-01-08 DIAGNOSIS — Z1231 Encounter for screening mammogram for malignant neoplasm of breast: Secondary | ICD-10-CM | POA: Diagnosis not present

## 2024-02-13 DIAGNOSIS — R42 Dizziness and giddiness: Secondary | ICD-10-CM | POA: Diagnosis not present

## 2024-02-13 DIAGNOSIS — Z8249 Family history of ischemic heart disease and other diseases of the circulatory system: Secondary | ICD-10-CM | POA: Diagnosis not present

## 2024-02-13 DIAGNOSIS — H16223 Keratoconjunctivitis sicca, not specified as Sjogren's, bilateral: Secondary | ICD-10-CM | POA: Diagnosis not present

## 2024-02-13 DIAGNOSIS — E785 Hyperlipidemia, unspecified: Secondary | ICD-10-CM | POA: Diagnosis not present

## 2024-02-13 DIAGNOSIS — M25473 Effusion, unspecified ankle: Secondary | ICD-10-CM | POA: Diagnosis not present

## 2024-02-13 DIAGNOSIS — Z1330 Encounter for screening examination for mental health and behavioral disorders, unspecified: Secondary | ICD-10-CM | POA: Diagnosis not present

## 2024-02-14 DIAGNOSIS — N6489 Other specified disorders of breast: Secondary | ICD-10-CM | POA: Diagnosis not present

## 2024-02-15 DIAGNOSIS — Z0000 Encounter for general adult medical examination without abnormal findings: Secondary | ICD-10-CM | POA: Diagnosis not present

## 2024-02-15 DIAGNOSIS — E559 Vitamin D deficiency, unspecified: Secondary | ICD-10-CM | POA: Diagnosis not present

## 2024-02-15 DIAGNOSIS — Z23 Encounter for immunization: Secondary | ICD-10-CM | POA: Diagnosis not present

## 2024-02-15 DIAGNOSIS — Z8639 Personal history of other endocrine, nutritional and metabolic disease: Secondary | ICD-10-CM | POA: Diagnosis not present

## 2024-02-16 DIAGNOSIS — R9230 Dense breasts, unspecified: Secondary | ICD-10-CM | POA: Diagnosis not present

## 2024-02-16 DIAGNOSIS — R928 Other abnormal and inconclusive findings on diagnostic imaging of breast: Secondary | ICD-10-CM | POA: Diagnosis not present

## 2024-03-19 DIAGNOSIS — Z8249 Family history of ischemic heart disease and other diseases of the circulatory system: Secondary | ICD-10-CM | POA: Diagnosis not present

## 2024-03-19 DIAGNOSIS — M25473 Effusion, unspecified ankle: Secondary | ICD-10-CM | POA: Diagnosis not present

## 2024-03-19 DIAGNOSIS — R42 Dizziness and giddiness: Secondary | ICD-10-CM | POA: Diagnosis not present

## 2024-03-19 DIAGNOSIS — E785 Hyperlipidemia, unspecified: Secondary | ICD-10-CM | POA: Diagnosis not present

## 2024-04-09 DIAGNOSIS — M25473 Effusion, unspecified ankle: Secondary | ICD-10-CM | POA: Diagnosis not present

## 2024-04-10 DIAGNOSIS — H16221 Keratoconjunctivitis sicca, not specified as Sjogren's, right eye: Secondary | ICD-10-CM | POA: Diagnosis not present

## 2024-04-10 DIAGNOSIS — H16231 Neurotrophic keratoconjunctivitis, right eye: Secondary | ICD-10-CM | POA: Diagnosis not present

## 2024-04-24 DIAGNOSIS — H16232 Neurotrophic keratoconjunctivitis, left eye: Secondary | ICD-10-CM | POA: Diagnosis not present

## 2024-04-24 DIAGNOSIS — H16222 Keratoconjunctivitis sicca, not specified as Sjogren's, left eye: Secondary | ICD-10-CM | POA: Diagnosis not present

## 2024-04-25 DIAGNOSIS — M542 Cervicalgia: Secondary | ICD-10-CM | POA: Diagnosis not present

## 2024-05-02 DIAGNOSIS — E063 Autoimmune thyroiditis: Secondary | ICD-10-CM | POA: Diagnosis not present

## 2024-05-02 DIAGNOSIS — N951 Menopausal and female climacteric states: Secondary | ICD-10-CM | POA: Diagnosis not present

## 2024-05-02 DIAGNOSIS — E782 Mixed hyperlipidemia: Secondary | ICD-10-CM | POA: Diagnosis not present

## 2024-05-02 DIAGNOSIS — E559 Vitamin D deficiency, unspecified: Secondary | ICD-10-CM | POA: Diagnosis not present

## 2024-05-02 DIAGNOSIS — E039 Hypothyroidism, unspecified: Secondary | ICD-10-CM | POA: Diagnosis not present

## 2024-05-02 DIAGNOSIS — E790 Hyperuricemia without signs of inflammatory arthritis and tophaceous disease: Secondary | ICD-10-CM | POA: Diagnosis not present

## 2024-05-02 DIAGNOSIS — Z91018 Allergy to other foods: Secondary | ICD-10-CM | POA: Diagnosis not present

## 2024-05-21 ENCOUNTER — Encounter: Payer: Self-pay | Admitting: Acute Care

## 2024-05-21 ENCOUNTER — Ambulatory Visit: Admitting: Acute Care

## 2024-05-21 VITALS — BP 132/82 | HR 65 | Ht 64.0 in | Wt 160.8 lb

## 2024-05-21 DIAGNOSIS — R911 Solitary pulmonary nodule: Secondary | ICD-10-CM

## 2024-05-21 DIAGNOSIS — Z8544 Personal history of malignant neoplasm of other female genital organs: Secondary | ICD-10-CM | POA: Diagnosis not present

## 2024-05-21 NOTE — Progress Notes (Signed)
 History of Present Illness Jillian Dennis is a 58 y.o. female Never smoker referred 04/2024 for abnormal chest imaging. She has a history of fallopian tube carcinoma.   Pt. Has consented to use of [employer] soft wear to help capture the content of this OV    05/21/2024 Pt. Presents for pulmonary nodule consult after abnormal CT Chest imaging. She was referred by Dr. Darlean.  Jillian Dennis is a 58 year old female with a history of fallopian tube carcinoma who presents with pulmonary nodules. She was referred for evaluation of an abnormal CT of the chest.  She has a history of fallopian tube carcinoma treated surgically with hysterectomy and oophorectomy ten years ago. During the initial surgery, breathing complications led to the discovery of lung nodules. Follow-up imaging at that time showed nodules, but they were not deemed concerning. Recently, during a cardiac workup, she was informed again about the presence of lung nodules, prompting the current evaluation.  A CT scan from 2019 showed multiple pulmonary nodules, with the largest in the right lower lobe measuring 6 millimeters. Previous imaging in 2018 and 2015 showed nodules measuring 5 millimeters or less and 4 millimeters, respectively. She is concerned about radiation exposure from repeated imaging. No weight loss, hemoptysis, or shortness of breath.No respiratory symptoms.   Her family history includes her mother having rheumatoid arthritis and possible scleroderma or lupus. She undergoes regular mammograms due to dense breast tissue, which often require follow-up ultrasounds and biopsies. No rashes or eye issues, and she undergoes annual eye exams.She has a history of high cholesterol, fallopian tube carcinoma, seasonal allergies, heartburn, difficulty swallowing ( EOE), earaches, and hand and feet swelling. . She had a hysterectomy 09/2014, tonsillectomy 1978. 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  Additional family history of heart disease in both parents,and gramdfathers, cancer in herself, her grandmother and Aunt.  We discussed the results of her scan and that there has been some slight growth over  time, but last imaging shows stable nodules that are most likely benign.We discussed that with her history of fallopian tube carcinoma, we do need to monitor for stability. As patient is concerned about radiation exposure, we will do a 3 month follow up scan and assess for stability at that time. She is in agreement with this plan. I have asked her to call to be sooner for any unexplained weight loss,hemoptysis or development of a chronic cough.  Test Results:  CT Calcium Scoring Chest 03/2024 [facility]>> No imaging to view, just written report Multiple nodules 6-58mm:  Low risk- CT at 3-59months, then consider CT at 18-24 months  High risk- CT at 3-22months, then at 18-24 months    CT Chest 03/2018 Four small pulmonary nodules in the right lung measuring 5 mm or less and are unchanged in size compared to prior. Favor benign nodules. 2. Marked improvement in bibasilar atelectasis.    Latest Ref Rng & Units 11/27/2014    9:30 AM 10/16/2014    5:05 PM 10/16/2014    5:28 AM  CBC  WBC 4.0 - 10.5 K/uL 5.7  4.7  4.8   Hemoglobin 12.0 - 15.0 g/dL 85.5  8.8  8.4   Hematocrit 36.0 - 46.0 % 42.4  26.4  25.8   Platelets 150 - 400 K/uL 238  151  127        Latest Ref Rng & Units 11/27/2014    9:30 AM 10/14/2014    7:20 PM  BMP  Glucose 70 - 99 mg/dL 899  99   BUN 6 - 23 mg/dL 9  6   Creatinine 9.49 - 1.10 mg/dL 9.25  9.11   Sodium 864 - 145 mmol/L 141  139   Potassium 3.5 - 5.1 mmol/L 4.3  3.8   Chloride 96 - 112 mmol/L 108  103   CO2 19 - 32 mmol/L 27  29   Calcium 8.4 - 10.5 mg/dL 9.3  8.0     BNP No results found for: BNP  ProBNP No results found for: PROBNP  PFT No results found for: FEV1PRE, FEV1POST, FVCPRE, FVCPOST, TLC, DLCOUNC, PREFEV1FVCRT,  PSTFEV1FVCRT  No results found.   Past medical hx Past Medical History:  Diagnosis Date   Complication of anesthesia    breathing problems after surgery in Dec.- from positionong during surgery and pressure on lungs   Hyperlipidemia    diet and exercise controlled - no med   SVD (spontaneous vaginal delivery)    x 2     Social History   Tobacco Use   Smoking status: Never   Smokeless tobacco: Never  Substance Use Topics   Alcohol use: No   Drug use: No    Ms.Dolman reports that she has never smoked. She has never used smokeless tobacco. She reports that she does not drink alcohol and does not use drugs.  Tobacco Cessation: Never smoker    Past surgical hx, Family hx, Social hx all reviewed.  Current Outpatient Medications on File Prior to Visit  Medication Sig   Calcium Carbonate-Vit D-Min (CALCIUM 1200) 1200-1000 MG-UNIT CHEW Chew 1 tablet by mouth daily.   estradiol  (CLIMARA  - DOSED IN MG/24 HR) 0.1 mg/24hr patch PLACE 1 PATCH ONTO THE SKIN ONCE A WEEK   meloxicam  (MOBIC ) 15 MG tablet TAKE 1 TABLET(15 MG) BY MOUTH DAILY   Multiple Vitamins-Minerals (MULTIVITAMIN WITH MINERALS) tablet Take 1 tablet by mouth daily.   Omega-3 Fatty Acids (FISH OIL) 1000 MG CAPS Take 1 capsule by mouth daily.   No current facility-administered medications on file prior to visit.     No Known Allergies  Review Of Systems:  Constitutional:   No  weight loss, night sweats,  Fevers, chills, fatigue, or  lassitude.  HEENT:   No headaches,  Difficulty swallowing,  Tooth/dental problems, or  Sore throat,                No sneezing, itching, ear ache, nasal congestion, post nasal drip,   CV:  No chest pain,  Orthopnea, PND, swelling in lower extremities, anasarca, dizziness, palpitations, syncope.   GI  No heartburn, indigestion, abdominal pain, nausea, vomiting, diarrhea, change in bowel habits, loss of appetite, bloody stools.   Resp: No shortness of breath with exertion or at rest.   No excess mucus, no productive cough,  No non-productive cough,  No coughing up of blood.  No change in color of mucus.  No wheezing.  No chest wall deformity  Skin: no rash or lesions.  GU: no dysuria, change in color of urine, no urgency or frequency.  No flank pain, no hematuria   MS:  No joint pain or swelling.  No decreased range of motion.  No back pain.  Psych:  No change in mood or affect. No depression or anxiety.  No memory loss.   Vital Signs BP 132/82 (BP Location: Left Arm, Cuff Size: Normal)   Pulse 65   Ht 5' 4 (1.626 m)   Wt 160 lb  12.8 oz (72.9 kg)   LMP 10/12/2014 (Exact Date)   SpO2 97%   BMI 27.60 kg/m    Physical Exam:  General- No distress,  A&Ox3, pleasant ENT: No sinus tenderness, TM clear, pale nasal mucosa, no oral exudate,no post nasal drip, no LAN Cardiac: S1, S2, regular rate and rhythm, no murmur Chest: No wheeze/ rales/ dullness; no accessory muscle use, no nasal flaring, no sternal retractions Abd.: Soft Non-tender, ND, BS +, Body mass index is 27.6 kg/m.  Ext: No clubbing cyanosis, edema, No obvious deformities Neuro:  normal strength, MAE x 4, A&O x 3, appropriate Skin: No rashes, warm and dry, No obvious deformities Psych: normal mood and behavior   Assessment/Plan Incidental pulmonary nodules noted of cardiac calcium scoring scan Never smoker  History of fallopian tube carcinoma Plan It is good to see you today. We have reviewed the results of your CT chest. We will do a dedicated  repeat Ct chest in 3 months. This will be due in October 2025. You will follow up with me 1-2 weeks after the scan to review the results. Call for any unexplained weight loss or blood in your sputum so we can see you sooner. Call if you need us . Please contact office for sooner follow up if symptoms do not improve or worsen or seek emergency care    I spent 30 minutes dedicated to the care of this patient on the date of this encounter to include  pre-visit review of records, face-to-face time with the patient discussing conditions above, post visit ordering of testing, clinical documentation with the electronic health record, making appropriate referrals as documented, and communicating necessary information to the patient's healthcare team.    Lauraine JULIANNA Lites, NP 05/21/2024  2:18 PM

## 2024-05-21 NOTE — Patient Instructions (Addendum)
 It is good to see you today. We have reviewed the results of your CT chest. We will do a dedicated  repeat Ct chest in 3 months. This will be due in October 2025. You will follow up with me 1-2 weeks after the scan to review the results. Call for any unexplained weight loss or blood in your sputum so we can see you sooner. Call if you need us . Please contact office for sooner follow up if symptoms do not improve or worsen or seek emergency care

## 2024-05-23 DIAGNOSIS — L820 Inflamed seborrheic keratosis: Secondary | ICD-10-CM | POA: Diagnosis not present

## 2024-05-26 ENCOUNTER — Encounter: Payer: Self-pay | Admitting: Acute Care

## 2024-05-28 NOTE — Progress Notes (Signed)
 I agree with the plans as outlined above.   Lamar Chris, MD, PhD 05/28/2024, 3:24 PM [facility] [phone] or if no answer before 7:00PM call [phone] For any issues after 7:00PM please call eLink [phone]

## 2024-06-28 DIAGNOSIS — H903 Sensorineural hearing loss, bilateral: Secondary | ICD-10-CM | POA: Diagnosis not present

## 2024-06-28 DIAGNOSIS — H8112 Benign paroxysmal vertigo, left ear: Secondary | ICD-10-CM | POA: Diagnosis not present

## 2024-06-28 DIAGNOSIS — H6992 Unspecified Eustachian tube disorder, left ear: Secondary | ICD-10-CM | POA: Diagnosis not present

## 2024-07-16 DIAGNOSIS — M2560 Stiffness of unspecified joint, not elsewhere classified: Secondary | ICD-10-CM | POA: Diagnosis not present

## 2024-07-16 DIAGNOSIS — H8112 Benign paroxysmal vertigo, left ear: Secondary | ICD-10-CM | POA: Diagnosis not present

## 2024-07-23 DIAGNOSIS — H8112 Benign paroxysmal vertigo, left ear: Secondary | ICD-10-CM | POA: Diagnosis not present

## 2024-07-23 DIAGNOSIS — M2560 Stiffness of unspecified joint, not elsewhere classified: Secondary | ICD-10-CM | POA: Diagnosis not present

## 2024-08-29 DIAGNOSIS — N6489 Other specified disorders of breast: Secondary | ICD-10-CM | POA: Diagnosis not present

## 2024-09-03 DIAGNOSIS — E785 Hyperlipidemia, unspecified: Secondary | ICD-10-CM | POA: Diagnosis not present

## 2024-09-10 ENCOUNTER — Ambulatory Visit (HOSPITAL_COMMUNITY)
Admission: RE | Admit: 2024-09-10 | Discharge: 2024-09-10 | Disposition: A | Discharge status: Home / Self Care | Source: Ambulatory Visit | Attending: Acute Care | Admitting: Acute Care

## 2024-09-10 DIAGNOSIS — R911 Solitary pulmonary nodule: Secondary | ICD-10-CM | POA: Diagnosis not present

## 2024-09-10 DIAGNOSIS — R918 Other nonspecific abnormal finding of lung field: Secondary | ICD-10-CM | POA: Diagnosis not present

## 2024-09-10 DIAGNOSIS — H16223 Keratoconjunctivitis sicca, not specified as Sjogren's, bilateral: Secondary | ICD-10-CM | POA: Diagnosis not present

## 2024-09-10 DIAGNOSIS — H16233 Neurotrophic keratoconjunctivitis, bilateral: Secondary | ICD-10-CM | POA: Diagnosis not present

## 2024-10-01 ENCOUNTER — Encounter: Payer: Self-pay | Admitting: Acute Care

## 2024-10-01 ENCOUNTER — Ambulatory Visit: Admitting: Acute Care

## 2024-10-01 VITALS — BP 108/70 | HR 58 | Temp 97.6°F | Ht 64.0 in | Wt 161.6 lb

## 2024-10-01 DIAGNOSIS — Z854A Personal history of malignant neoplasm of fallopian tube(s): Secondary | ICD-10-CM

## 2024-10-01 DIAGNOSIS — R9389 Abnormal findings on diagnostic imaging of other specified body structures: Secondary | ICD-10-CM

## 2024-10-01 DIAGNOSIS — R918 Other nonspecific abnormal finding of lung field: Secondary | ICD-10-CM | POA: Diagnosis not present

## 2024-10-01 DIAGNOSIS — R911 Solitary pulmonary nodule: Secondary | ICD-10-CM | POA: Diagnosis not present

## 2024-10-01 NOTE — Progress Notes (Signed)
 History of Present Illness DOTTY GONZALO is a 58 y.o. female  Never smoker referred 04/2024 for abnormal chest imaging. She has a history of fallopian tube carcinoma.   Synopsis BRIANNIE GUTIERREZ is a 58 year old female with a history of fallopian tube carcinoma who presents with pulmonary nodules. She was referred for evaluation of an abnormal CT of the chest.   She has a history of fallopian tube carcinoma treated surgically with hysterectomy and oophorectomy ten years ago. During the initial surgery, breathing complications led to the discovery of lung nodules. Follow-up imaging at that time showed nodules, but they were not deemed concerning. Recently, during a cardiac workup, she was informed again about the presence of lung nodules, prompting the current evaluation.   A CT scan from 2019 showed multiple pulmonary nodules, with the largest in the right lower lobe measuring 6 millimeters. Previous imaging in 2018 and 2015 showed nodules measuring 5 millimeters or less and 4 millimeters, respectively. She is concerned about radiation exposure from repeated imaging. No weight loss, hemoptysis, or shortness of breath.No respiratory symptoms.    Her family history includes her mother having rheumatoid arthritis and possible scleroderma or lupus. She undergoes regular mammograms due to dense breast tissue, which often require follow-up ultrasounds and biopsies. No rashes or eye issues, and she undergoes annual eye exams.She has a history of high cholesterol, fallopian tube carcinoma, seasonal allergies, heartburn, difficulty swallowing ( EOE), earaches, and hand and feet swelling. . She had a hysterectomy 09/2014, tonsillectomy 1978. 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 Additional family history of heart disease in both parents,and gramdfathers, cancer in herself, her grandmother and Aunt.  Her most recent scan shows some slight growth over the last 6 years.  Plan  after her first visit was to repeat a CT scan in 3 months to reevaluate the nodule for stability.  She is here today to review the results of the November 2025 scan.   10/01/2024 Discussed the use of AI scribe software for clinical note transcription with the patient, who gave verbal consent to proceed.  History of Present Illness Patient presents for follow-up to review results of 58-month follow-up CT chest to ensure stability of several less than 6 mm pulmonary nodules noted on a recent cardiac calcium scoring scan.  We have reviewed the results of the scan.  These redemonstrated bilateral solid pulmonary nodules all of which measure 5 mm or less.  There is a right middle lobe nodule measuring 4 mm and a right lower lobe nodule measuring 5 mm.  These are unchanged from the previous imaging.  Plan will be for a 1 year follow-up to ensure stability primarily out of extreme caution due to her previous cancer diagnosis.  Patient is in agreement with this plan.  Follow-up imaging will be due in November 2026.  Patient has been asked to call to be seen sooner if she develops any unexplained weight loss or has any hemoptysis when she coughs.  She verbalized understanding.     Test Results: CT Chest 09/10/2024 Redemonstrated multiple bilateral solid pulmonary nodules, all 5 mm, unchanged from 04/20/2018, consistent with multiple solid nodules <6 mm. Given stability since 2019 these are benign and no follow up is recommended.  CT Calcium Scoring Chest 03/2024 [facility]>> No imaging to view, just written report Multiple nodules 6-75mm:  Low risk- CT at 3-64months, then consider CT at 18-24 months  High risk- CT at 3-88months, then at  18-24 months      CT Chest 03/2018 Four small pulmonary nodules in the right lung measuring 5 mm or less and are unchanged in size compared to prior. Favor benign nodules. 2. Marked improvement in bibasilar atelectasis.    Latest Ref Rng & Units 11/27/2014    9:30 AM  10/16/2014    5:05 PM 10/16/2014    5:28 AM  CBC  WBC 4.0 - 10.5 K/uL 5.7  4.7  4.8   Hemoglobin 12.0 - 15.0 g/dL 85.5  8.8  8.4   Hematocrit 36.0 - 46.0 % 42.4  26.4  25.8   Platelets 150 - 400 K/uL 238  151  127        Latest Ref Rng & Units 11/27/2014    9:30 AM 10/14/2014    7:20 PM  BMP  Glucose 70 - 99 mg/dL 899  99   BUN 6 - 23 mg/dL 9  6   Creatinine 9.49 - 1.10 mg/dL 9.25  9.11   Sodium 864 - 145 mmol/L 141  139   Potassium 3.5 - 5.1 mmol/L 4.3  3.8   Chloride 96 - 112 mmol/L 108  103   CO2 19 - 32 mmol/L 27  29   Calcium 8.4 - 10.5 mg/dL 9.3  8.0     BNP No results found for: BNP  ProBNP No results found for: PROBNP  PFT No results found for: FEV1PRE, FEV1POST, FVCPRE, FVCPOST, TLC, DLCOUNC, PREFEV1FVCRT, PSTFEV1FVCRT  CT CHEST WO CONTRAST Result Date: 09/11/2024 EXAM: CT CHEST WITHOUT CONTRAST 09/10/2024 06:50:39 AM TECHNIQUE: CT of the chest was performed without the administration of intravenous contrast. Multiplanar reformatted images are provided for review. Automated exposure control, iterative reconstruction, and/or weight based adjustment of the mA/kV was utilized to reduce the radiation dose to as low as reasonably achievable. COMPARISON: CT Chest 04/20/2018. CLINICAL HISTORY: Lung nodule, 6-5mm, denies chest complaints. FINDINGS: MEDIASTINUM: Heart and pericardium are unremarkable. The central airways are clear. LYMPH NODES: No mediastinal, hilar or axillary lymphadenopathy. LUNGS AND PLEURA: Redemonstrated bilateral pulmonary nodules all of which measure 5 mm or less. For example, index nodule in the right middle lobe (series 4, image 74) again measures 4 mm and a nodule in the right lower lobe (series 4, image 91) again measures 5 mm. No focal consolidation or pulmonary edema. No pleural effusion or pneumothorax. SOFT TISSUES/BONES: No acute abnormality of the bones or soft tissues. UPPER ABDOMEN: Limited images of the upper abdomen  demonstrates no acute abnormality. IMPRESSION: 1. Redemonstrated multiple bilateral solid pulmonary nodules, all 5 mm, unchanged from 04/20/2018, consistent with multiple solid nodules <6 mm. Given stability since 2019 these are benign and no follow up is recommended. Electronically signed by: Norman Gatlin MD 09/11/2024 11:42 PM EST RP Workstation: [id number]     Past medical hx Past Medical History:  Diagnosis Date   Complication of anesthesia    breathing problems after surgery in Dec.- from positionong during surgery and pressure on lungs   Hyperlipidemia    diet and exercise controlled - no med   SVD (spontaneous vaginal delivery)    x 2     Social History   Tobacco Use   Smoking status: Never    Passive exposure: Never   Smokeless tobacco: Never  Substance Use Topics   Alcohol use: No   Drug use: No    Ms.Mcdonnell reports that she has never smoked. She has never been exposed to tobacco smoke. She has never used smokeless tobacco. She reports  that she does not drink alcohol and does not use drugs.  Tobacco Cessation: Counseling given: Not Answered Patient is a never smoker  Past surgical hx, Family hx, Social hx all reviewed.  Current Outpatient Medications on File Prior to Visit  Medication Sig   ALPHAGAN P 0.1 % SOLN Apply to eye.   Calcium Carbonate-Vit D-Min (CALCIUM 1200) 1200-1000 MG-UNIT CHEW Chew 1 tablet by mouth daily.   estradiol  (CLIMARA  - DOSED IN MG/24 HR) 0.1 mg/24hr patch PLACE 1 PATCH ONTO THE SKIN ONCE A WEEK   Multiple Vitamins-Minerals (MULTIVITAMIN WITH MINERALS) tablet Take 1 tablet by mouth daily.   Omega-3 Fatty Acids (FISH OIL) 1000 MG CAPS Take 1 capsule by mouth daily.   REPATHA SURECLICK 140 MG/ML SOAJ Inject 140 mg into the skin.   No current facility-administered medications on file prior to visit.     No Known Allergies  Review Of Systems:  Constitutional:   No  weight loss, night sweats,  Fevers, chills, fatigue, or   lassitude.  HEENT:   No headaches,  Difficulty swallowing,  Tooth/dental problems, or  Sore throat,                No sneezing, itching, ear ache, nasal congestion, post nasal drip,   CV:  No chest pain,  Orthopnea, PND, swelling in lower extremities, anasarca, dizziness, palpitations, syncope.   GI  No heartburn, indigestion, abdominal pain, nausea, vomiting, diarrhea, change in bowel habits, loss of appetite, bloody stools.   Resp: No shortness of breath with exertion or at rest.  No excess mucus, no productive cough,  No non-productive cough,  No coughing up of blood.  No change in color of mucus.  No wheezing.  No chest wall deformity  Skin: no rash or lesions.  GU: no dysuria, change in color of urine, no urgency or frequency.  No flank pain, no hematuria   MS:  No joint pain or swelling.  No decreased range of motion.  No back pain.  Psych:  No change in mood or affect. No depression or anxiety.  No memory loss.   Vital Signs BP 108/70   Pulse (!) 58   Temp 97.6 F (36.4 C) (Oral)   Ht 5' 4 (1.626 m)   Wt 161 lb 9.6 oz (73.3 kg)   LMP 10/12/2014 (Exact Date)   SpO2 99%   BMI 27.74 kg/m    Physical Exam:  General- No distress,  A&Ox3, pleasant and appropriate ENT: No sinus tenderness, TM clear, pale nasal mucosa, no oral exudate,no post nasal drip, no LAN Cardiac: S1, S2, regular rate and rhythm, no murmur Chest: No wheeze/ rales/ dullness; no accessory muscle use, no nasal flaring, no sternal retractions Abd.: Soft Non-tender, nondistended, bowel sounds positive,Body mass index is 27.74 kg/m.  Ext: No clubbing cyanosis, edema, no obvious deformities Neuro:  normal strength, moving all extremities x 4, alert and oriented x 3. Skin: No rashes, warm and dry, no obvious skin lesions Psych: normal mood and behavior  Physical Exam    Assessment/Plan Incidental pulmonary nodules noted on cardiac calcium scoring scan 7/ 2025 Never smoker History of fallopian tube  carcinoma Stable nodules on CT chest 09/19/2024>> 2 solid nodules less than 5 mm and unchanged from previous imaging Plan We have reviewed your scan . The nodules of concern are stable. We will do a 1 year follow up scan due 08/2025 to maintain surveillance. Follow up with me 1-2 weeks after the scan to review results. Call for  any unexplained weight loss or blood in your sputum when you cough. Happy Holidays. Call if you need us  sooner. Please contact office for sooner follow up if symptoms do not improve or worsen or seek emergency care     I spent 15 minutes dedicated to the care of this patient on the date of this encounter to include pre-visit review of records, face-to-face time with the patient discussing conditions above, post visit ordering of testing, clinical documentation with the electronic health record, making appropriate referrals as documented, and communicating necessary information to the patient's healthcare team.   Assessment & Plan        Lauraine JULIANNA Lites, NP 10/01/2024  4:05 PM

## 2024-10-01 NOTE — Patient Instructions (Signed)
 It is good to see you today. We have reviewed your scan . The nodules of concern are stable. We will do a 1 year follow up scan due 08/2025 to maintain surveillance. Follow up with me 1-2 weeks after the scan to review results. Call for any unexplained weight loss or blood in your sputum when you cough. Happy Holidays. Call if you need us  sooner. Please contact office for sooner follow up if symptoms do not improve or worsen or seek emergency care
